# Patient Record
Sex: Female | Born: 1960 | Race: White | Hispanic: No | Marital: Married | Smoking: Current every day smoker
Health system: Southern US, Community
[De-identification: ages and names within clinical notes are randomized; demographics above are authoritative.]

## PROBLEM LIST (undated history)

## (undated) DIAGNOSIS — I10 Essential (primary) hypertension: Secondary | ICD-10-CM

## (undated) DIAGNOSIS — L309 Dermatitis, unspecified: Secondary | ICD-10-CM

## (undated) DIAGNOSIS — G43909 Migraine, unspecified, not intractable, without status migrainosus: Secondary | ICD-10-CM

## (undated) DIAGNOSIS — E7800 Pure hypercholesterolemia, unspecified: Secondary | ICD-10-CM

## (undated) HISTORY — DX: Dermatitis, unspecified: L30.9

## (undated) HISTORY — PX: FACIAL FRACTURE SURGERY: SHX1570

## (undated) HISTORY — PX: ANKLE FUSION: SHX881

---

## 2001-08-17 ENCOUNTER — Other Ambulatory Visit: Admission: RE | Admit: 2001-08-17 | Discharge: 2001-08-17 | Payer: Self-pay | Admitting: Family Medicine

## 2002-05-27 ENCOUNTER — Encounter: Payer: Self-pay | Admitting: Family Medicine

## 2002-05-27 ENCOUNTER — Encounter: Admission: RE | Admit: 2002-05-27 | Discharge: 2002-05-27 | Payer: Self-pay | Admitting: Family Medicine

## 2002-11-29 ENCOUNTER — Ambulatory Visit (HOSPITAL_BASED_OUTPATIENT_CLINIC_OR_DEPARTMENT_OTHER): Admission: RE | Admit: 2002-11-29 | Discharge: 2002-11-29 | Payer: Self-pay | Admitting: Orthopedic Surgery

## 2003-08-08 ENCOUNTER — Encounter: Admission: RE | Admit: 2003-08-08 | Discharge: 2003-08-08 | Payer: Self-pay | Admitting: Orthopedic Surgery

## 2006-07-19 LAB — HM PAP SMEAR

## 2006-07-22 LAB — HM DEXA SCAN

## 2006-08-11 ENCOUNTER — Ambulatory Visit: Payer: Self-pay

## 2006-08-30 ENCOUNTER — Ambulatory Visit: Payer: Self-pay

## 2007-03-14 ENCOUNTER — Emergency Department: Payer: Self-pay | Admitting: Emergency Medicine

## 2007-03-28 ENCOUNTER — Ambulatory Visit: Payer: Self-pay

## 2009-10-23 ENCOUNTER — Emergency Department: Payer: Self-pay | Admitting: Emergency Medicine

## 2010-05-07 ENCOUNTER — Emergency Department: Payer: Self-pay | Admitting: Emergency Medicine

## 2010-06-30 ENCOUNTER — Ambulatory Visit: Payer: Self-pay

## 2011-10-15 ENCOUNTER — Ambulatory Visit: Payer: Self-pay | Admitting: Family Medicine

## 2011-11-07 ENCOUNTER — Emergency Department: Payer: Self-pay | Admitting: Emergency Medicine

## 2011-11-18 LAB — HM COLONOSCOPY

## 2013-05-30 ENCOUNTER — Ambulatory Visit: Payer: Self-pay | Admitting: Family Medicine

## 2013-10-23 ENCOUNTER — Ambulatory Visit: Payer: Self-pay | Admitting: Family Medicine

## 2014-01-17 LAB — BASIC METABOLIC PANEL
BUN: 9 mg/dL (ref 4–21)
Creatinine: 1 mg/dL (ref 0.5–1.1)
Glucose: 109 mg/dL
Potassium: 5 mmol/L (ref 3.4–5.3)
Sodium: 143 mmol/L (ref 137–147)

## 2014-01-17 LAB — CBC AND DIFFERENTIAL
HCT: 42 % (ref 36–46)
HEMOGLOBIN: 14.4 g/dL (ref 12.0–16.0)
NEUTROS ABS: 45 /uL
PLATELETS: 256 10*3/uL (ref 150–399)
WBC: 6.3 10*3/mL

## 2014-01-17 LAB — HEPATIC FUNCTION PANEL
ALT: 19 U/L (ref 7–35)
AST: 15 U/L (ref 13–35)
Alkaline Phosphatase: 80 U/L (ref 25–125)
Bilirubin, Total: 0.6 mg/dL

## 2014-01-17 LAB — LIPID PANEL
Cholesterol: 162 mg/dL (ref 0–200)
HDL: 38 mg/dL (ref 35–70)
LDL CALC: 98 mg/dL
LDl/HDL Ratio: 2.6
Triglycerides: 131 mg/dL (ref 40–160)

## 2014-01-22 ENCOUNTER — Ambulatory Visit: Payer: Self-pay | Admitting: Family Medicine

## 2014-01-22 LAB — HM MAMMOGRAPHY: HM Mammogram: NEGATIVE

## 2014-09-12 ENCOUNTER — Emergency Department (HOSPITAL_COMMUNITY): Payer: Worker's Compensation

## 2014-09-12 ENCOUNTER — Encounter (HOSPITAL_COMMUNITY): Payer: Self-pay | Admitting: Nurse Practitioner

## 2014-09-12 ENCOUNTER — Emergency Department (HOSPITAL_COMMUNITY)
Admission: EM | Admit: 2014-09-12 | Discharge: 2014-09-12 | Disposition: A | Discharge status: Home / Self Care | Payer: Worker's Compensation | Attending: Emergency Medicine | Admitting: Emergency Medicine

## 2014-09-12 DIAGNOSIS — S0181XA Laceration without foreign body of other part of head, initial encounter: Secondary | ICD-10-CM

## 2014-09-12 DIAGNOSIS — S060X1A Concussion with loss of consciousness of 30 minutes or less, initial encounter: Secondary | ICD-10-CM | POA: Insufficient documentation

## 2014-09-12 DIAGNOSIS — G43909 Migraine, unspecified, not intractable, without status migrainosus: Secondary | ICD-10-CM | POA: Diagnosis not present

## 2014-09-12 DIAGNOSIS — I10 Essential (primary) hypertension: Secondary | ICD-10-CM | POA: Diagnosis not present

## 2014-09-12 DIAGNOSIS — W19XXXA Unspecified fall, initial encounter: Secondary | ICD-10-CM

## 2014-09-12 DIAGNOSIS — W01198A Fall on same level from slipping, tripping and stumbling with subsequent striking against other object, initial encounter: Secondary | ICD-10-CM | POA: Diagnosis not present

## 2014-09-12 DIAGNOSIS — S79921A Unspecified injury of right thigh, initial encounter: Secondary | ICD-10-CM | POA: Insufficient documentation

## 2014-09-12 DIAGNOSIS — Z791 Long term (current) use of non-steroidal anti-inflammatories (NSAID): Secondary | ICD-10-CM | POA: Diagnosis not present

## 2014-09-12 DIAGNOSIS — Y998 Other external cause status: Secondary | ICD-10-CM | POA: Insufficient documentation

## 2014-09-12 DIAGNOSIS — E780 Pure hypercholesterolemia: Secondary | ICD-10-CM | POA: Diagnosis not present

## 2014-09-12 DIAGNOSIS — Y9301 Activity, walking, marching and hiking: Secondary | ICD-10-CM | POA: Diagnosis not present

## 2014-09-12 DIAGNOSIS — S0990XA Unspecified injury of head, initial encounter: Secondary | ICD-10-CM | POA: Diagnosis present

## 2014-09-12 DIAGNOSIS — Z79899 Other long term (current) drug therapy: Secondary | ICD-10-CM | POA: Diagnosis not present

## 2014-09-12 DIAGNOSIS — Y92480 Sidewalk as the place of occurrence of the external cause: Secondary | ICD-10-CM | POA: Diagnosis not present

## 2014-09-12 DIAGNOSIS — S8011XA Contusion of right lower leg, initial encounter: Secondary | ICD-10-CM | POA: Insufficient documentation

## 2014-09-12 HISTORY — DX: Essential (primary) hypertension: I10

## 2014-09-12 HISTORY — DX: Migraine, unspecified, not intractable, without status migrainosus: G43.909

## 2014-09-12 HISTORY — DX: Pure hypercholesterolemia, unspecified: E78.00

## 2014-09-12 MED ORDER — IBUPROFEN 800 MG PO TABS: 800.0000 mg | ORAL_TABLET | Freq: Three times a day (TID) | ORAL | Status: DC

## 2014-09-12 MED ORDER — HYDROCODONE-ACETAMINOPHEN 5-325 MG PO TABS: 1.0000 | ORAL_TABLET | Freq: Four times a day (QID) | ORAL | Status: DC | PRN

## 2014-09-12 MED ORDER — LIDOCAINE-EPINEPHRINE 2 %-1:100000 IJ SOLN
20.0000 mL | Freq: Once | INTRAMUSCULAR | Status: AC
  Administered 2014-09-12: 20 mL
  Filled 2014-09-12: qty 1

## 2014-09-12 NOTE — ED Notes (Signed)
Pt presents with an obvious head injury, 1-2 inch head lac secondary to a fall, unsure if there was LOC, denies being on anticoagulants, AOx4 at this time, controled scant bleeding. NAD

## 2014-09-12 NOTE — ED Notes (Signed)
Bed: WA07 Expected date:  Expected time:  Means of arrival:  Comments: EMS 

## 2014-09-12 NOTE — Discharge Instructions (Signed)
Concussion A concussion is a brain injury. It is caused by:  A hit to the head.  A quick and sudden movement (jolt) of the head or neck. A concussion is usually not life threatening. Even so, it can cause serious problems. If you had a concussion before, you may have concussion-like problems after a hit to your head. HOME CARE General Instructions  Follow your doctor's directions carefully.  Take medicines only as told by your doctor.  Only take medicines your doctor says are safe.  Do not drink alcohol until your doctor says it is okay. Alcohol and some drugs can slow down healing. They can also put you at risk for further injury.  If you are having trouble remembering things, write them down.  Try to do one thing at a time if you get distracted easily. For example, do not watch TV while making dinner.  Talk to your family members or close friends when making important decisions.  Follow up with your doctor as told.  Watch your symptoms. Tell others to do the same. Serious problems can sometimes happen after a concussion. Older adults are more likely to have these problems.  Tell your teachers, school nurse, school counselor, coach, Event organiserathletic trainer, or work Production designer, theatre/television/filmmanager about your concussion. Tell them about what you can or cannot do. They should watch to see if:  It gets even harder for you to pay attention or concentrate.  It gets even harder for you to remember things or learn new things.  You need more time than normal to finish things.  You become annoyed (irritable) more than before.  You are not able to deal with stress as well.  You have more problems than before.  Rest. Make sure you:  Get plenty of sleep at night.  Go to sleep early.  Go to bed at the same time every day. Try to wake up at the same time.  Rest during the day.  Take naps when you feel tired.  Limit activities where you have to think a lot or concentrate. These include:  Doing  homework.  Doing work related to a job.  Watching TV.  Using the computer. Returning To Your Regular Activities Return to your normal activities slowly, not all at once. You must give your body and brain enough time to heal.   Do not play sports or do other athletic activities until your doctor says it is okay.  Ask your doctor when you can drive, ride a bicycle, or work other vehicles or machines. Never do these things if you feel dizzy.  Ask your doctor about when you can return to work or school. Preventing Another Concussion It is very important to avoid another brain injury, especially before you have healed. In rare cases, another injury can lead to permanent brain damage, brain swelling, or death. The risk of this is greatest during the first 7-10 days after your injury. Avoid injuries by:   Wearing a seat belt when riding in a car.  Not drinking too much alcohol.  Avoiding activities that could lead to a second concussion (such as contact sports).  Wearing a helmet when doing activities like:  Biking.  Skiing.  Skateboarding.  Skating.  Making your home safer by:  Removing things from the floor or stairways that could make you trip.  Using grab bars in bathrooms and handrails by stairs.  Placing non-slip mats on floors and in bathtubs.  Improve lighting in dark areas. GET HELP IF:  It  gets even harder for you to pay attention or concentrate.  It gets even harder for you to remember things or learn new things.  You need more time than normal to finish things.  You become annoyed (irritable) more than before.  You are not able to deal with stress as well.  You have more problems than before.  You have problems keeping your balance.  You are not able to react quickly when you should. Get help if you have any of these problems for more than 2 weeks:   Lasting (chronic) headaches.  Dizziness or trouble balancing.  Feeling sick to your stomach  (nausea).  Seeing (vision) problems.  Being affected by noises or light more than normal.  Feeling sad, low, down in the dumps, blue, gloomy, or empty (depressed).  Mood changes (mood swings).  Feeling of fear or nervousness about what may happen (anxiety).  Feeling annoyed.  Memory problems.  Problems concentrating or paying attention.  Sleep problems.  Feeling tired all the time. GET HELP RIGHT AWAY IF:   You have bad headaches or your headaches get worse.  You have weakness (even if it is in one hand, leg, or part of the face).  You have loss of feeling (numbness).  You feel off balance.  You keep throwing up (vomiting).  You feel tired.  One black center of your eye (pupil) is larger than the other.  You twitch or shake violently (convulse).  Your speech is not clear (slurred).  You are more confused, easily angered (agitated), or annoyed than before.  You have more trouble resting than before.  You are unable to recognize people or places.  You have neck pain.  It is difficult to wake you up.  You have unusual behavior changes.  You pass out (lose consciousness). MAKE SURE YOU:   Understand these instructions.  Will watch your condition.  Will get help right away if you are not doing well or get worse. Document Released: 07/21/2009 Document Revised: 12/17/2013 Document Reviewed: 02/22/2013 [facility] Patient Information 2015 [place], [place]. This information is not intended to replace advice given to you by your health care provider. Make sure you discuss any questions you have with your health care provider.  Laceration Care, Adult A laceration is a cut or lesion that goes through all layers of the skin and into the tissue just beneath the skin. TREATMENT  Some lacerations may not require closure. Some lacerations may not be able to be closed due to an increased risk of infection. It is important to see your caregiver as soon as possible after an  injury to minimize the risk of infection and maximize the opportunity for successful closure. If closure is appropriate, pain medicines may be given, if needed. The wound will be cleaned to help prevent infection. Your caregiver will use stitches (sutures), staples, wound glue (adhesive), or skin adhesive strips to repair the laceration. These tools bring the skin edges together to allow for faster healing and a better cosmetic outcome. However, all wounds will heal with a scar. Once the wound has healed, scarring can be minimized by covering the wound with sunscreen during the day for 1 full year. HOME CARE INSTRUCTIONS  For sutures or staples:  Keep the wound clean and dry.  If you were given a bandage (dressing), you should change it at least once a day. Also, change the dressing if it becomes wet or dirty, or as directed by your caregiver.  Wash the wound with soap  and water 2 times a day. Rinse the wound off with water to remove all soap. Pat the wound dry with a clean towel.  After cleaning, apply a thin layer of the antibiotic ointment as recommended by your caregiver. This will help prevent infection and keep the dressing from sticking.  You may shower as usual after the first 24 hours. Do not soak the wound in water until the sutures are removed.  Only take over-the-counter or prescription medicines for pain, discomfort, or fever as directed by your caregiver.  Get your sutures or staples removed as directed by your caregiver. For skin adhesive strips:  Keep the wound clean and dry.  Do not get the skin adhesive strips wet. You may bathe carefully, using caution to keep the wound dry.  If the wound gets wet, pat it dry with a clean towel.  Skin adhesive strips will fall off on their own. You may trim the strips as the wound heals. Do not remove skin adhesive strips that are still stuck to the wound. They will fall off in time. For wound adhesive:  You may briefly wet your wound  in the shower or bath. Do not soak or scrub the wound. Do not swim. Avoid periods of heavy perspiration until the skin adhesive has fallen off on its own. After showering or bathing, gently pat the wound dry with a clean towel.  Do not apply liquid medicine, cream medicine, or ointment medicine to your wound while the skin adhesive is in place. This may loosen the film before your wound is healed.  If a dressing is placed over the wound, be careful not to apply tape directly over the skin adhesive. This may cause the adhesive to be pulled off before the wound is healed.  Avoid prolonged exposure to sunlight or tanning lamps while the skin adhesive is in place. Exposure to ultraviolet light in the first year will darken the scar.  The skin adhesive will usually remain in place for 5 to 10 days, then naturally fall off the skin. Do not pick at the adhesive film. You may need a tetanus shot if:  You cannot remember when you had your last tetanus shot.  You have never had a tetanus shot. If you get a tetanus shot, your arm may swell, get red, and feel warm to the touch. This is common and not a problem. If you need a tetanus shot and you choose not to have one, there is a rare chance of getting tetanus. Sickness from tetanus can be serious. SEEK MEDICAL CARE IF:   You have redness, swelling, or increasing pain in the wound.  You see a red line that goes away from the wound.  You have yellowish-white fluid (pus) coming from the wound.  You have a fever.  You notice a bad smell coming from the wound or dressing.  Your wound breaks open before or after sutures have been removed.  You notice something coming out of the wound such as wood or glass.  Your wound is on your hand or foot and you cannot move a finger or toe. SEEK IMMEDIATE MEDICAL CARE IF:   Your pain is not controlled with prescribed medicine.  You have severe swelling around the wound causing pain and numbness or a change in  color in your arm, hand, leg, or foot.  Your wound splits open and starts bleeding.  You have worsening numbness, weakness, or loss of function of any joint around or beyond the  wound.  You develop painful lumps near the wound or on the skin anywhere on your body. MAKE SURE YOU:   Understand these instructions.  Will watch your condition.  Will get help right away if you are not doing well or get worse. Document Released: 08/02/2005 Document Revised: 10/25/2011 Document Reviewed: 01/26/2011 [facility] Patient Information 2015 [place]. This information is not intended to replace advice given to you by your health care provider. Make sure you discuss any questions you have with your health care provider.  Hematoma A hematoma is a collection of blood under the skin, in an organ, in a body space, in a joint space, or in other tissue. The blood can clot to form a lump that you can see and feel. The lump is often firm and may sometimes become sore and tender. Most hematomas get better in a few days to weeks. However, some hematomas may be serious and require medical care. Hematomas can range in size from very small to very large. CAUSES  A hematoma can be caused by a blunt or penetrating injury. It can also be caused by spontaneous leakage from a blood vessel under the skin. Spontaneous leakage from a blood vessel is more likely to occur in older people, especially those taking blood thinners. Sometimes, a hematoma can develop after certain medical procedures. SIGNS AND SYMPTOMS   A firm lump on the body.  Possible pain and tenderness in the area.  Bruising.Blue, dark blue, purple-red, or yellowish skin may appear at the site of the hematoma if the hematoma is close to the surface of the skin. For hematomas in deeper tissues or body spaces, the signs and symptoms may be subtle. For example, an intra-abdominal hematoma may cause abdominal pain, weakness, fainting, and shortness of breath.  An intracranial hematoma may cause a headache or symptoms such as weakness, trouble speaking, or a change in consciousness. DIAGNOSIS  A hematoma can usually be diagnosed based on your medical history and a physical exam. Imaging tests may be needed if your health care provider suspects a hematoma in deeper tissues or body spaces, such as the abdomen, head, or chest. These tests may include ultrasonography or a CT scan.  TREATMENT  Hematomas usually go away on their own over time. Rarely does the blood need to be drained out of the body. Large hematomas or those that may affect vital organs will sometimes need surgical drainage or monitoring. HOME CARE INSTRUCTIONS   Apply ice to the injured area:   Put ice in a plastic bag.   Place a towel between your skin and the bag.   Leave the ice on for 20 minutes, 2-3 times a day for the first 1 to 2 days.   After the first 2 days, switch to using warm compresses on the hematoma.   Elevate the injured area to help decrease pain and swelling. Wrapping the area with an elastic bandage may also be helpful. Compression helps to reduce swelling and promotes shrinking of the hematoma. Make sure the bandage is not wrapped too tight.   If your hematoma is on a lower extremity and is painful, crutches may be helpful for a couple days.   Only take over-the-counter or prescription medicines as directed by your health care provider. SEEK IMMEDIATE MEDICAL CARE IF:   You have increasing pain, or your pain is not controlled with medicine.   You have a fever.   You have worsening swelling or discoloration.   Your skin over the  hematoma breaks or starts bleeding.   Your hematoma is in your chest or abdomen and you have weakness, shortness of breath, or a change in consciousness.  Your hematoma is on your scalp (caused by a fall or injury) and you have a worsening headache or a change in alertness or consciousness. MAKE SURE YOU:   Understand  these instructions.  Will watch your condition.  Will get help right away if you are not doing well or get worse. Document Released: 03/16/2004 Document Revised: 04/04/2013 Document Reviewed: 01/10/2013 [facility] Patient Information 2015 [place], [place]. This information is not intended to replace advice given to you by your health care provider. Make sure you discuss any questions you have with your health care provider.

## 2014-09-12 NOTE — ED Provider Notes (Signed)
CSN: [id number]     Arrival date & time 09/12/14  2012 History   First MD Initiated Contact with Patient 09/12/14 2016     Chief Complaint  Patient presents with  . Head Injury  . Fall   HPI  Patient is a 54 year old female with past medical history of hypertension, hypercholesterolemia, and migraines who presents emergency room for evaluation after a fall. Patient states that she was walking down the sidewalk and tripped over an object that she kicked with her foot and the next thing she knew she was on the ground looking up. She fell and struck her head. She is unsure whether she lost consciousness. She did "cut over her right eye that has been bothering her. She is not on blood thinners. Patient denies any changes in her vision, confusion, nausea, vomiting, neck pain, back pain, shortness breath, dizziness.  Patient does note that she is having some pain in her right hip. She thinks that she may have fallen on the hip. Patient also notes that she has pain in her right ear. She states that she recently had an ear infection and vertigo in that ear.  Past Medical History  Diagnosis Date  . Hypertension   . Hypercholesteremia   . Migraine    Past Surgical History  Procedure Laterality Date  . Facial fracture surgery     History reviewed. No pertinent family history. History  Substance Use Topics  . Smoking status: Never Smoker   . Smokeless tobacco: Not on file  . Alcohol Use: No   OB History    No data available     Review of Systems  Constitutional: Negative for fever, chills and fatigue.  Eyes: Negative for visual disturbance.  Respiratory: Negative for chest tightness and shortness of breath.   Cardiovascular: Negative for chest pain and palpitations.  Gastrointestinal: Negative for nausea, vomiting, abdominal pain, diarrhea and constipation.  Musculoskeletal: Negative for back pain and neck pain.  Neurological: Positive for headaches. Negative for dizziness, syncope,  weakness and light-headedness.  Psychiatric/Behavioral: Negative for confusion.  All other systems reviewed and are negative.     Allergies  Review of patient's allergies indicates no known allergies.  Home Medications   Prior to Admission medications   Medication Sig Start Date End Date Taking? Authorizing Provider  ALPRAZolam Prudy Feeler) 0.5 MG tablet Take 0.5 mg by mouth daily.   Yes Historical Provider, MD  esomeprazole (NEXIUM) 40 MG capsule Take 40 mg by mouth daily at 12 noon.   Yes Historical Provider, MD  gabapentin (NEURONTIN) 300 MG capsule Take 300 mg by mouth at bedtime.   Yes Historical Provider, MD  HYDROcodone-acetaminophen (NORCO/VICODIN) 5-325 MG per tablet Take 1 tablet by mouth every 6 (six) hours as needed for moderate pain or severe pain. 09/12/14   Mahiya Kercheval A Forcucci, PA-C  ibuprofen (ADVIL,MOTRIN) 800 MG tablet Take 1 tablet (800 mg total) by mouth 3 (three) times daily. 09/12/14   Clairissa Valvano A Forcucci, PA-C  meloxicam (MOBIC) 7.5 MG tablet Take 7.5 mg by mouth 2 (two) times daily.   Yes Historical Provider, MD  predniSONE (DELTASONE) 10 MG tablet Take 20 mg by mouth 2 (two) times daily. 2 tablets in the morning and 2 tablets in the evening   Yes Historical Provider, MD  propranolol (INNOPRAN XL) 120 MG 24 hr capsule Take 120 mg by mouth at bedtime.   Yes Historical Provider, MD  sertraline (ZOLOFT) 100 MG tablet Take 100 mg by mouth at bedtime.   Yes  Historical Provider, MD  simvastatin (ZOCOR) 20 MG tablet Take 20 mg by mouth daily.   Yes Historical Provider, MD  tiZANidine (ZANAFLEX) 4 MG tablet Take 4 mg by mouth 2 (two) times daily as needed for muscle spasms.   Yes Historical Provider, MD  Vitamin D, Ergocalciferol, (DRISDOL) 50000 UNITS CAPS capsule Take 50,000 Units by mouth every 30 (thirty) days.   Yes Historical Provider, MD   BP 157/92 mmHg  Pulse 59  Temp(Src) 98.9 F (37.2 C) (Oral)  Resp 16  SpO2 97%  LMP  (LMP Unknown) Physical Exam   Constitutional: She is oriented to person, place, and time. She appears well-developed and well-nourished. No distress.  HENT:  Head: Normocephalic.  Mouth/Throat: Oropharynx is clear and moist. No oropharyngeal exudate.  2 cm laceration surrounding hematoma. Cut has no bleeding actively. Wound appears to be clean and without visible foreign bodies. It is tender to palpation.  Eyes: Conjunctivae and EOM are normal. Pupils are equal, round, and reactive to light. No scleral icterus.  Neck: Normal range of motion and full passive range of motion without pain. Neck supple. No JVD present. No spinous process tenderness and no muscular tenderness present. No thyromegaly present.  Cardiovascular: Normal rate, regular rhythm, normal heart sounds and intact distal pulses.  Exam reveals no gallop and no friction rub.   No murmur heard. Pulmonary/Chest: Effort normal and breath sounds normal. No respiratory distress. She has no wheezes. She has no rales. She exhibits no tenderness.  Abdominal: Soft. Bowel sounds are normal. She exhibits no distension and no mass. There is no tenderness. There is no rebound and no guarding.  Musculoskeletal:  Mild tenderness palpation of the right thigh. Full passive range of motion with pain. There is irritability with internal and sternal rotation of the right hip.  Lymphadenopathy:    She has no cervical adenopathy.  Neurological: She is alert and oriented to person, place, and time. She has normal strength. No cranial nerve deficit or sensory deficit. Coordination normal.  Skin: Skin is warm and dry. She is not diaphoretic.  Psychiatric: She has a normal mood and affect. Her behavior is normal. Judgment and thought content normal.  Nursing note and vitals reviewed.   ED Course  Procedures (including critical care time) LACERATION REPAIR Performed by: Eben Burow Authorized by: Terri Piedra A Consent: Verbal consent obtained. Risks and benefits:  risks, benefits and alternatives were discussed Consent given by: patient Patient identity confirmed: provided demographic data Prepped and Draped in normal sterile fashion Wound explored  Laceration Location: right eyebrow  Laceration Length: 1 cm  No Foreign Bodies seen or palpated  Anesthesia: local infiltration  Local anesthetic: lidocaine 1% w epinephrine  Anesthetic total: 2 ml  Irrigation method: syringe Amount of cleaning: standard  Skin closure: close, ethilon 5-0  Number of sutures: 2  Technique: simple interupted  Patient tolerance: Patient tolerated the procedure well with no immediate complications.   Labs Reviewed - No data to display  Imaging Review Ct Head Wo Contrast  09/12/2014   CLINICAL DATA:  Larey Seat today, striking right superior orbit on sidewalk.  EXAM: CT HEAD WITHOUT CONTRAST  TECHNIQUE: Contiguous axial images were obtained from the base of the skull through the vertex without intravenous contrast.  COMPARISON:  None.  FINDINGS: There is no intracranial hemorrhage or extra-axial fluid collection.  The brain and CSF spaces appear unremarkable.  The calvarium and skullbase are intact. There is a right frontal scalp hematoma  IMPRESSION: Negative for  acute intracranial traumatic injury   Electronically Signed   By: Ellery Plunkaniel R Mitchell M.D.   On: 09/12/2014 21:20   Dg Hip Unilat With Pelvis 2-3 Views Right  09/12/2014   CLINICAL DATA:  The patient tripped while walking tonight. Right hip and mid upper leg pain. Initial encounter.  EXAM: DG HIP W/ PELVIS 2-3V*R*  COMPARISON:  None.  FINDINGS: Both hips are located. No fracture is identified. No degenerative change is seen. IUD is noted.  IMPRESSION: Negative exam.   Electronically Signed   By: Drusilla Kannerhomas  Dalessio M.D.   On: 09/12/2014 21:11     EKG Interpretation None      MDM   Final diagnoses:  Fall  Mild concussion, with loss of consciousness of 30 minutes or less, initial encounter  Facial  laceration, initial encounter  Leg hematoma, right, initial encounter   Patient is a 54 year old female who presents emergency room for evaluation after a fall. Patient has no neurological deficits on examination. Patient is unsure of loss of consciousness and had some forgetfulness. Head CT performed. Head CT negative for acute brain bleeding. Patient likely has mild concussion. I have discussed resting her brain, and refraining from activities because her headaches. I have encouraged her to gradually return to exercise as long as she is headache free. Patient did have a laceration which was repaired as seen above. T Given here. Patient to have stitches removed in 3-5 days. Right hip also x-rayed. Patient does have a hematoma with no acute fractures on x-ray. We'll discharge home with short course of hydrocodone. Patient to follow-up with PCP as needed.  Patient discussed with Dr. Jeraldine LootsLockwood who agrees with the above workup and plan. Patient stable for discharge.    Eben Burowourtney A Forcucci, PA-C 09/12/14 [id number]  Gerhard Munchobert Lockwood, MD 09/12/14 [phone]

## 2014-09-26 LAB — HEMOGLOBIN A1C: Hgb A1c MFr Bld: 6.1 % — AB (ref 4.0–6.0)

## 2014-09-26 LAB — TSH: TSH: 1.25 u[IU]/mL (ref 0.41–5.90)

## 2014-11-01 ENCOUNTER — Ambulatory Visit: Payer: Self-pay | Admitting: Neurology

## 2014-12-19 ENCOUNTER — Encounter: Payer: Self-pay | Admitting: Emergency Medicine

## 2014-12-19 DIAGNOSIS — R319 Hematuria, unspecified: Secondary | ICD-10-CM | POA: Insufficient documentation

## 2014-12-19 DIAGNOSIS — E1169 Type 2 diabetes mellitus with other specified complication: Secondary | ICD-10-CM | POA: Insufficient documentation

## 2014-12-19 DIAGNOSIS — R5383 Other fatigue: Secondary | ICD-10-CM | POA: Insufficient documentation

## 2014-12-19 DIAGNOSIS — G43909 Migraine, unspecified, not intractable, without status migrainosus: Secondary | ICD-10-CM | POA: Insufficient documentation

## 2014-12-19 DIAGNOSIS — F411 Generalized anxiety disorder: Secondary | ICD-10-CM | POA: Insufficient documentation

## 2014-12-19 DIAGNOSIS — G4730 Sleep apnea, unspecified: Secondary | ICD-10-CM | POA: Insufficient documentation

## 2014-12-19 DIAGNOSIS — K219 Gastro-esophageal reflux disease without esophagitis: Secondary | ICD-10-CM | POA: Insufficient documentation

## 2014-12-19 DIAGNOSIS — F17200 Nicotine dependence, unspecified, uncomplicated: Secondary | ICD-10-CM | POA: Insufficient documentation

## 2014-12-19 DIAGNOSIS — G4700 Insomnia, unspecified: Secondary | ICD-10-CM | POA: Insufficient documentation

## 2014-12-19 DIAGNOSIS — J42 Unspecified chronic bronchitis: Secondary | ICD-10-CM | POA: Insufficient documentation

## 2014-12-19 DIAGNOSIS — R2681 Unsteadiness on feet: Secondary | ICD-10-CM | POA: Insufficient documentation

## 2014-12-19 DIAGNOSIS — F0781 Postconcussional syndrome: Secondary | ICD-10-CM | POA: Insufficient documentation

## 2014-12-19 DIAGNOSIS — S060X9A Concussion with loss of consciousness of unspecified duration, initial encounter: Secondary | ICD-10-CM | POA: Insufficient documentation

## 2014-12-19 DIAGNOSIS — I10 Essential (primary) hypertension: Secondary | ICD-10-CM | POA: Insufficient documentation

## 2014-12-19 DIAGNOSIS — R7303 Prediabetes: Secondary | ICD-10-CM | POA: Insufficient documentation

## 2014-12-19 DIAGNOSIS — O139 Gestational [pregnancy-induced] hypertension without significant proteinuria, unspecified trimester: Secondary | ICD-10-CM | POA: Insufficient documentation

## 2014-12-19 DIAGNOSIS — E7800 Pure hypercholesterolemia, unspecified: Secondary | ICD-10-CM | POA: Insufficient documentation

## 2014-12-19 DIAGNOSIS — F339 Major depressive disorder, recurrent, unspecified: Secondary | ICD-10-CM | POA: Insufficient documentation

## 2014-12-19 DIAGNOSIS — F329 Major depressive disorder, single episode, unspecified: Secondary | ICD-10-CM | POA: Insufficient documentation

## 2014-12-19 DIAGNOSIS — S060XAA Concussion with loss of consciousness status unknown, initial encounter: Secondary | ICD-10-CM | POA: Insufficient documentation

## 2014-12-19 DIAGNOSIS — E538 Deficiency of other specified B group vitamins: Secondary | ICD-10-CM | POA: Insufficient documentation

## 2014-12-19 DIAGNOSIS — E785 Hyperlipidemia, unspecified: Secondary | ICD-10-CM | POA: Insufficient documentation

## 2014-12-19 DIAGNOSIS — F430 Acute stress reaction: Secondary | ICD-10-CM | POA: Insufficient documentation

## 2014-12-19 DIAGNOSIS — N951 Menopausal and female climacteric states: Secondary | ICD-10-CM | POA: Insufficient documentation

## 2014-12-19 DIAGNOSIS — E1159 Type 2 diabetes mellitus with other circulatory complications: Secondary | ICD-10-CM | POA: Insufficient documentation

## 2014-12-19 DIAGNOSIS — J309 Allergic rhinitis, unspecified: Secondary | ICD-10-CM | POA: Insufficient documentation

## 2014-12-19 DIAGNOSIS — I152 Hypertension secondary to endocrine disorders: Secondary | ICD-10-CM | POA: Insufficient documentation

## 2014-12-19 DIAGNOSIS — E559 Vitamin D deficiency, unspecified: Secondary | ICD-10-CM | POA: Insufficient documentation

## 2015-01-31 ENCOUNTER — Ambulatory Visit: Payer: Self-pay

## 2015-01-31 ENCOUNTER — Ambulatory Visit (INDEPENDENT_AMBULATORY_CARE_PROVIDER_SITE_OTHER): Payer: 59

## 2015-01-31 ENCOUNTER — Encounter: Payer: Self-pay | Admitting: Physician Assistant

## 2015-01-31 ENCOUNTER — Ambulatory Visit (INDEPENDENT_AMBULATORY_CARE_PROVIDER_SITE_OTHER): Payer: 59 | Admitting: Physician Assistant

## 2015-01-31 ENCOUNTER — Other Ambulatory Visit: Payer: Self-pay

## 2015-01-31 VITALS — BP 110/60 | HR 66 | Temp 97.7°F | Resp 16 | Ht 66.0 in | Wt 185.6 lb

## 2015-01-31 DIAGNOSIS — M26629 Arthralgia of temporomandibular joint, unspecified side: Secondary | ICD-10-CM | POA: Insufficient documentation

## 2015-01-31 DIAGNOSIS — E538 Deficiency of other specified B group vitamins: Secondary | ICD-10-CM | POA: Diagnosis not present

## 2015-01-31 DIAGNOSIS — J040 Acute laryngitis: Secondary | ICD-10-CM

## 2015-01-31 MED ORDER — CYANOCOBALAMIN 1000 MCG/ML IJ SOLN
1000.0000 ug | Freq: Once | INTRAMUSCULAR | Status: AC
  Administered 2015-01-31: 1000 ug via INTRAMUSCULAR

## 2015-01-31 NOTE — Progress Notes (Signed)
   Subjective:    Patient ID: Lisa Alvarado, female    DOB: 08/16/1961, 54 y.o.   MRN: [id number]  Cough This is a new problem. The current episode started yesterday. The problem has been unchanged. The problem occurs every few minutes. The cough is non-productive. Associated symptoms include nasal congestion and a sore throat. Pertinent negatives include no chest pain, chills, ear congestion, ear pain, fever, headaches, heartburn, hemoptysis, myalgias, postnasal drip, rash, rhinorrhea, shortness of breath, sweats, weight loss or wheezing. Nothing aggravates the symptoms. Risk factors for lung disease include animal exposure. She has tried nothing for the symptoms. Her past medical history is significant for environmental allergies. There is no history of asthma, bronchiectasis, bronchitis, COPD, emphysema or pneumonia.  Sore Throat  This is a new problem. The current episode started yesterday. The problem has been unchanged. Neither side of throat is experiencing more pain than the other. There has been no fever. The pain is at a severity of 1/10. Associated symptoms include congestion, coughing and a hoarse voice. Pertinent negatives include no abdominal pain, diarrhea, drooling, ear discharge, ear pain, headaches, plugged ear sensation, neck pain, shortness of breath, stridor, swollen glands, trouble swallowing or vomiting. She has had no exposure to strep or mono. She has tried nothing for the symptoms.      Review of Systems  Constitutional: Negative for fever, chills and weight loss.  HENT: Positive for congestion, hoarse voice and sore throat. Negative for drooling, ear discharge, ear pain, postnasal drip, rhinorrhea and trouble swallowing.   Respiratory: Positive for cough. Negative for hemoptysis, shortness of breath, wheezing and stridor.   Cardiovascular: Negative for chest pain.  Gastrointestinal: Negative for heartburn, vomiting, abdominal pain and diarrhea.  Musculoskeletal:  Negative for myalgias and neck pain.  Skin: Negative for rash.  Allergic/Immunologic: Positive for environmental allergies.  Neurological: Negative for headaches.       Objective:   Physical Exam  Constitutional: She appears well-developed and well-nourished. No distress.  HENT:  Head: Normocephalic and atraumatic.  Right Ear: Hearing, tympanic membrane, external ear and ear canal normal.  Left Ear: Hearing, tympanic membrane, external ear and ear canal normal.  Nose: Nose normal.  Mouth/Throat: Uvula is midline, oropharynx is clear and moist and mucous membranes are normal. No oropharyngeal exudate.  Eyes: Conjunctivae are normal. Pupils are equal, round, and reactive to light. Right eye exhibits no discharge. Left eye exhibits no discharge. No scleral icterus.  Neck: Normal range of motion. Neck supple. No tracheal deviation present. No thyromegaly present.  Cardiovascular: Normal rate, regular rhythm and intact distal pulses.  Exam reveals no gallop and no friction rub.   Murmur (known systolic murmur) heard. Pulmonary/Chest: Effort normal and breath sounds normal. No stridor. No respiratory distress. She has no wheezes. She has no rales. She exhibits no tenderness.  Lymphadenopathy:    She has no cervical adenopathy.  Skin: Skin is warm and dry. She is not diaphoretic.  Vitals reviewed.         Assessment & Plan:  1. Laryngitis New onset.  Advised to treat conservatively with voice rest, keep throat moist with frequent sips of liquid and hard, sugar-free candies.  Can use cough drops and delsym cough syrup at night to help her sleep.

## 2015-01-31 NOTE — Patient Instructions (Signed)
Laryngitis °At the top of your windpipe is your voice box. It is the source of your voice. Inside your voice box are 2 bands of muscles called vocal cords. When you breathe, your vocal cords are relaxed and open so that air can get into the lungs. When you decide to say something, these cords come together and vibrate. The sound from these vibrations goes into your throat and comes out through your mouth as sound.  °Laryngitis is an inflammation of the vocal cords that causes hoarseness, cough, loss of voice, sore throat, and dry throat. Laryngitis can be temporary (acute) or long-term (chronic). Most cases of acute laryngitis improve with time.Chronic laryngitis lasts for more than 3 weeks. °CAUSES °Laryngitis can often be related to excessive smoking, talking, or yelling, as well as inhalation of toxic fumes and allergies. Acute laryngitis is usually caused by a viral infection, vocal strain, measles or mumps, or bacterial infections. Chronic laryngitis is usually caused by vocal cord strain, vocal cord injury, postnasal drip, growths on the vocal cords, or acid reflux. °SYMPTOMS  °· Cough. °· Sore throat. °· Dry throat. °RISK FACTORS °· Respiratory infections. °· Exposure to irritating substances, such as cigarette smoke, excessive amounts of alcohol, stomach acids, and workplace chemicals. °· Voice trauma, such as vocal cord injury from shouting or speaking too loud. °DIAGNOSIS  °Your cargiver will perform a physical exam. During the physical exam, your caregiver will examine your throat. The most common sign of laryngitis is hoarseness. Laryngoscopy may be necessary to confirm the diagnosis of this condition. This procedure allows your caregiver to look into the larynx. °HOME CARE INSTRUCTIONS °· Drink enough fluids to keep your urine clear or pale yellow. °· Rest until you no longer have symptoms or as directed by your caregiver. °· Breathe in moist air. °· Take all medicine as directed by your  caregiver. °· Do not smoke. °· Talk as little as possible (this includes whispering). °· Write on paper instead of talking until your voice is back to normal. °· Follow up with your caregiver if your condition has not improved after 10 days. °SEEK MEDICAL CARE IF:  °· You have trouble breathing. °· You cough up blood. °· You have persistent fever. °· You have increasing pain. °· You have difficulty swallowing. °MAKE SURE YOU: °· Understand these instructions. °· Will watch your condition. °· Will get help right away if you are not doing well or get worse. °Document Released: 08/02/2005 Document Revised: 10/25/2011 Document Reviewed: 10/08/2010 °[employer]® Patient Information ©2015 [employer]. This information is not intended to replace advice given to you by your health care provider. Make sure you discuss any questions you have with your health care provider. ° °

## 2015-01-31 NOTE — Progress Notes (Signed)
Vitamin B12 injection today in office. Tolerated well. Will come back in 4 weeks per MD's order.

## 2015-02-04 ENCOUNTER — Ambulatory Visit (INDEPENDENT_AMBULATORY_CARE_PROVIDER_SITE_OTHER): Payer: 59 | Admitting: Family Medicine

## 2015-02-04 ENCOUNTER — Encounter: Payer: Self-pay | Admitting: Family Medicine

## 2015-02-04 VITALS — BP 100/60 | HR 60 | Temp 98.4°F | Resp 16 | Wt 182.8 lb

## 2015-02-04 DIAGNOSIS — R42 Dizziness and giddiness: Secondary | ICD-10-CM

## 2015-02-04 DIAGNOSIS — H6692 Otitis media, unspecified, left ear: Secondary | ICD-10-CM

## 2015-02-04 MED ORDER — AMOXICILLIN 500 MG PO CAPS: 1000.0000 mg | ORAL_CAPSULE | Freq: Two times a day (BID) | ORAL | Status: AC

## 2015-02-04 NOTE — Progress Notes (Signed)
      Patient: Lisa Alvarado Female    DOB: August 04, 1961   54 y.o.   MRN: [id number] Visit Date: 02/04/2015  Today's Provider: Mila Merry, MD   No chief complaint on file.  Subjective:    Dizziness This is a new problem. The current episode started in the past 7 days. The problem occurs intermittently. The problem has been gradually worsening. Associated symptoms include coughing, fatigue and headaches. Pertinent negatives include no chills, diaphoresis, fever, nausea, numbness, visual change or vomiting. The symptoms are aggravated by bending and standing. She has tried rest for the symptoms. The treatment provided mild relief.  Otalgia  There is pain in both (Left ear hurt more) ears. This is a new problem. The current episode started in the past 7 days. The problem occurs constantly. The problem has been gradually worsening. There has been no fever. The pain is at a severity of 5/10. The pain is moderate. Associated symptoms include coughing, headaches and hearing loss. Pertinent negatives include no vomiting. She has tried ear drops for the symptoms. The treatment provided no relief.  Patient had Vertigo in January 2016. Patient has fallen several times since then and has had evaluation by ENT and neurology. Started having left ear pain last week, Seen PA on 16th with viral URi, but pain has been getting worse, and staring to have light headed spells with sudden movements and when standing quickly.       Review of Systems  Constitutional: Positive for fatigue. Negative for fever, chills and diaphoresis.  HENT: Positive for ear pain and hearing loss.   Respiratory: Positive for cough and chest tightness.   Cardiovascular: Negative.   Gastrointestinal: Negative for nausea and vomiting.  Endocrine: Negative for cold intolerance, heat intolerance and polydipsia.  Neurological: Positive for dizziness and headaches. Negative for speech difficulty, light-headedness and numbness.     History  Substance Use Topics  . Smoking status: Never Smoker   . Smokeless tobacco: Not on file  . Alcohol Use: No   Objective:   BP 100/60 mmHg  Pulse 60  Temp(Src) 98.4 F (36.9 C) (Oral)  Resp 16  Wt 182 lb 12.8 oz (82.918 kg)  SpO2 98%  Physical Exam General Appearance:    Alert, cooperative, no distress  HENT:   right TM normal without fluid or infection, left TM red, dull, bulging, neck without nodes, throat normal without erythema or exudate and pharynx erythematous without exudate  Eyes:    PERRL, conjunctiva/corneas clear, EOM's intact       Lungs:     Clear to auscultation bilaterally, respirations unlabored  Heart:    Regular rate and rhythm  Neurologic:   Awake, alert, oriented x 3. No apparent focal neurological           defect.         Assessment & Plan:     1. Dizziness Ongoing for several months, but has recently been more c/w vertigo, possibly related to acute URI. Will check labs and treat OM as below. Return for further evaluation by her PCP if labs are normal and not improving with treatment of OM  - Comprehensive metabolic panel - CBC - Magnesium  2. Otitis media follow-up, not resolved, left    - amoxicillin (AMOXIL) 500 MG capsule; Take 2 capsules (1,000 mg total) by mouth 2 (two) times daily.  Dispense: 40 capsule; Refill: 0  Mila Merry, MD 02/04/2015

## 2015-02-05 ENCOUNTER — Other Ambulatory Visit: Payer: Self-pay

## 2015-02-05 ENCOUNTER — Telehealth: Payer: Self-pay

## 2015-02-05 DIAGNOSIS — M26629 Arthralgia of temporomandibular joint, unspecified side: Secondary | ICD-10-CM

## 2015-02-05 LAB — CBC
HEMATOCRIT: 42.4 % (ref 34.0–46.6)
HEMOGLOBIN: 14.7 g/dL (ref 11.1–15.9)
MCH: 30.8 pg (ref 26.6–33.0)
MCHC: 34.7 g/dL (ref 31.5–35.7)
MCV: 89 fL (ref 79–97)
Platelets: 241 10*3/uL (ref 150–379)
RBC: 4.78 x10E6/uL (ref 3.77–5.28)
RDW: 13.6 % (ref 12.3–15.4)
WBC: 7.8 10*3/uL (ref 3.4–10.8)

## 2015-02-05 LAB — COMPREHENSIVE METABOLIC PANEL
ALK PHOS: 98 IU/L (ref 39–117)
ALT: 12 IU/L (ref 0–32)
AST: 16 IU/L (ref 0–40)
Albumin/Globulin Ratio: 1.5 (ref 1.1–2.5)
Albumin: 4.6 g/dL (ref 3.5–5.5)
BUN / CREAT RATIO: 20 (ref 9–23)
BUN: 19 mg/dL (ref 6–24)
Bilirubin Total: 0.3 mg/dL (ref 0.0–1.2)
CHLORIDE: 100 mmol/L (ref 97–108)
CO2: 21 mmol/L (ref 18–29)
Calcium: 9.2 mg/dL (ref 8.7–10.2)
Creatinine, Ser: 0.96 mg/dL (ref 0.57–1.00)
GFR calc non Af Amer: 68 mL/min/{1.73_m2} (ref >59)
GFR, EST AFRICAN AMERICAN: 78 mL/min/{1.73_m2} (ref >59)
GLUCOSE: 110 mg/dL — AB (ref 65–99)
Globulin, Total: 3.1 g/dL (ref 1.5–4.5)
Potassium: 4 mmol/L (ref 3.5–5.2)
SODIUM: 140 mmol/L (ref 134–144)
TOTAL PROTEIN: 7.7 g/dL (ref 6.0–8.5)

## 2015-02-05 LAB — MAGNESIUM: Magnesium: 2.3 mg/dL (ref 1.6–2.3)

## 2015-02-05 MED ORDER — MELOXICAM 7.5 MG PO TABS: 7.5000 mg | ORAL_TABLET | Freq: Two times a day (BID) | ORAL | Status: DC

## 2015-02-05 NOTE — Telephone Encounter (Signed)
Pt advised as directed below.   Thanks,   -Oryon Gary  

## 2015-02-05 NOTE — Telephone Encounter (Signed)
-----   Message from Malva Limes, MD sent at 02/05/2015  1:08 PM EDT ----- Labs are all normal. Dizziness may be a result of ear infection. Call or return for further evaluation of dizziness is not better when finished with antibiotic.

## 2015-03-05 ENCOUNTER — Ambulatory Visit (INDEPENDENT_AMBULATORY_CARE_PROVIDER_SITE_OTHER): Payer: 59

## 2015-03-05 ENCOUNTER — Ambulatory Visit: Payer: 59

## 2015-03-05 ENCOUNTER — Other Ambulatory Visit: Payer: Self-pay

## 2015-03-05 DIAGNOSIS — E538 Deficiency of other specified B group vitamins: Secondary | ICD-10-CM | POA: Diagnosis not present

## 2015-03-05 MED ORDER — CYANOCOBALAMIN 1000 MCG/ML IJ SOLN
1000.0000 ug | Freq: Once | INTRAMUSCULAR | Status: AC
  Administered 2015-03-05: 1000 ug via INTRAMUSCULAR

## 2015-03-05 NOTE — Telephone Encounter (Signed)
Pt requiring medication refill for simvastatin and esomeprazole, she stated that she has an appointment for a physical with Wyatt PortelaJenni Bernett, PA  on August 3th.

## 2015-03-05 NOTE — Progress Notes (Signed)
Received Vitamin B 12 injection today. Tolerated well. Will come back in 4 weeks per MD's order.

## 2015-03-06 MED ORDER — SIMVASTATIN 20 MG PO TABS: 20.0000 mg | ORAL_TABLET | Freq: Every day | ORAL | Status: DC

## 2015-03-06 MED ORDER — ESOMEPRAZOLE MAGNESIUM 40 MG PO CPDR: 40.0000 mg | DELAYED_RELEASE_CAPSULE | Freq: Two times a day (BID) | ORAL | Status: DC

## 2015-03-07 ENCOUNTER — Encounter: Payer: Self-pay | Admitting: Physician Assistant

## 2015-03-19 ENCOUNTER — Ambulatory Visit (INDEPENDENT_AMBULATORY_CARE_PROVIDER_SITE_OTHER): Payer: 59 | Admitting: Physician Assistant

## 2015-03-19 ENCOUNTER — Encounter: Payer: Self-pay | Admitting: Physician Assistant

## 2015-03-19 VITALS — BP 144/84 | HR 64 | Temp 97.6°F | Resp 16 | Ht 66.0 in | Wt 182.4 lb

## 2015-03-19 DIAGNOSIS — R600 Localized edema: Secondary | ICD-10-CM | POA: Diagnosis not present

## 2015-03-19 DIAGNOSIS — Z0000 Encounter for general adult medical examination without abnormal findings: Secondary | ICD-10-CM

## 2015-03-19 DIAGNOSIS — Z124 Encounter for screening for malignant neoplasm of cervix: Secondary | ICD-10-CM

## 2015-03-19 DIAGNOSIS — Z1239 Encounter for other screening for malignant neoplasm of breast: Secondary | ICD-10-CM

## 2015-03-19 DIAGNOSIS — B379 Candidiasis, unspecified: Secondary | ICD-10-CM | POA: Diagnosis not present

## 2015-03-19 MED ORDER — FUROSEMIDE 20 MG PO TABS: 20.0000 mg | ORAL_TABLET | Freq: Every day | ORAL | Status: DC

## 2015-03-19 MED ORDER — FLUCONAZOLE 150 MG PO TABS: 150.0000 mg | ORAL_TABLET | Freq: Once | ORAL | Status: DC
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
## 2015-03-19 NOTE — Patient Instructions (Signed)
Health Maintenance Adopting a healthy lifestyle and getting preventive care can go a long way to promote health and wellness. Talk with your health care provider about what schedule of regular examinations is right for you. This is a good chance for you to check in with your provider about disease prevention and staying healthy. In between checkups, there are plenty of things you can do on your own. Experts have done a lot of research about which lifestyle changes and preventive measures are most likely to keep you healthy. Ask your health care provider for more information. WEIGHT AND DIET  Eat a healthy diet 1. Be sure to include plenty of vegetables, fruits, low-fat dairy products, and lean protein. 2. Do not eat a lot of foods high in solid fats, added sugars, or salt. 3. Get regular exercise. This is one of the most important things you can do for your health. 1. Most adults should exercise for at least 150 minutes each week. The exercise should increase your heart rate and make you sweat (moderate-intensity exercise). 2. Most adults should also do strengthening exercises at least twice a week. This is in addition to the moderate-intensity exercise.  Maintain a healthy weight 1. Body mass index (BMI) is a measurement that can be used to identify possible weight problems. It estimates body fat based on height and weight. Your health care provider can help determine your BMI and help you achieve or maintain a healthy weight. 2. For females 6 years of age and older:  1. A BMI below 18.5 is considered underweight. 2. A BMI of 18.5 to 24.9 is normal. 3. A BMI of 25 to 29.9 is considered overweight. 4. A BMI of 30 and above is considered obese.  Watch levels of cholesterol and blood lipids 1. You should start having your blood tested for lipids and cholesterol at 54 years of age, then have this test every 5 years. 2. You may need to have your cholesterol levels checked more often if: 1. Your  lipid or cholesterol levels are high. 2. You are older than 54 years of age. 3. You are at high risk for heart disease.  CANCER SCREENING   Lung Cancer 1. Lung cancer screening is recommended for adults 7-54 years old who are at high risk for lung cancer because of a history of smoking. 2. A yearly low-dose CT scan of the lungs is recommended for people who: 1. Currently smoke. 2. Have quit within the past 15 years. 3. Have at least a 30-pack-year history of smoking. A pack year is smoking an average of one pack of cigarettes a day for 1 year. 3. Yearly screening should continue until it has been 15 years since you quit. 4. Yearly screening should stop if you develop a health problem that would prevent you from having lung cancer treatment.  Breast Cancer  Practice breast self-awareness. This means understanding how your breasts normally appear and feel.  It also means doing regular breast self-exams. Let your health care provider know about any changes, no matter how small.  If you are in your 20s or 30s, you should have a clinical breast exam (CBE) by a health care provider every 1-3 years as part of a regular health exam.  If you are 54 or older, have a CBE every year. Also consider having a breast X-Mad[place] (mammogram) every year.  If you have a family history of breast cancer, talk to your health care provider about genetic screening.  If you are  at high risk for breast cancer, talk to your health care provider about having an MRI and a mammogram every year.  Breast cancer gene (BRCA) assessment is recommended for women who have family members with BRCA-related cancers. BRCA-related cancers include:  Breast.  Ovarian.  Tubal.  Peritoneal cancers.  Results of the assessment will determine the need for genetic counseling and BRCA1 and BRCA2 testing. Cervical Cancer Routine pelvic examinations to screen for cervical cancer are no longer recommended for nonpregnant women who  are considered low risk for cancer of the pelvic organs (ovaries, uterus, and vagina) and who do not have symptoms. A pelvic examination may be necessary if you have symptoms including those associated with pelvic infections. Ask your health care provider if a screening pelvic exam is right for you.   The Pap test is the screening test for cervical cancer for women who are considered at risk.  If you had a hysterectomy for a problem that was not cancer or a condition that could lead to cancer, then you no longer need Pap tests.  If you are older than 54 years, and you have had normal Pap tests for the past 10 years, you no longer need to have Pap tests.  If you have had past treatment for cervical cancer or a condition that could lead to cancer, you need Pap tests and screening for cancer for at least 20 years after your treatment.  If you no longer get a Pap test, assess your risk factors if they change (such as having a new sexual partner). This can affect whether you should start being screened again.  Some women have medical problems that increase their chance of getting cervical cancer. If this is the case for you, your health care provider may recommend more frequent screening and Pap tests.  The human papillomavirus (HPV) test is another test that may be used for cervical cancer screening. The HPV test looks for the virus that can cause cell changes in the cervix. The cells collected during the Pap test can be tested for HPV.  The HPV test can be used to screen women 2 years of age and older. Getting tested for HPV can extend the interval between normal Pap tests from three to five years.  An HPV test also should be used to screen women of any age who have unclear Pap test results.  After 54 years of age, women should have HPV testing as often as Pap tests.  Colorectal Cancer  This type of cancer can be detected and often prevented.  Routine colorectal cancer screening usually  begins at 54 years of age and continues through 54 years of age.  Your health care provider may recommend screening at an earlier age if you have risk factors for colon cancer.  Your health care provider may also recommend using home test kits to check for hidden blood in the stool.  A small camera at the end of a tube can be used to examine your colon directly (sigmoidoscopy or colonoscopy). This is done to check for the earliest forms of colorectal cancer.  Routine screening usually begins at age 57.  Direct examination of the colon should be repeated every 5-10 years through 54 years of age. However, you may need to be screened more often if early forms of precancerous polyps or small growths are found. Skin Cancer  Check your skin from head to toe regularly.  Tell your health care provider about any new moles or changes in  moles, especially if there is a change in a mole's shape or color.  Also tell your health care provider if you have a mole that is larger than the size of a pencil eraser.  Always use sunscreen. Apply sunscreen liberally and repeatedly throughout the day.  Protect yourself by wearing long sleeves, pants, a wide-brimmed hat, and sunglasses whenever you are outside. HEART DISEASE, DIABETES, AND HIGH BLOOD PRESSURE   Have your blood pressure checked at least every 1-2 years. High blood pressure causes heart disease and increases the risk of stroke.  If you are between 32 years and 30 years old, ask your health care provider if you should take aspirin to prevent strokes.  Have regular diabetes screenings. This involves taking a blood sample to check your fasting blood sugar level.  If you are at a normal weight and have a low risk for diabetes, have this test once every three years after 54 years of age.  If you are overweight and have a high risk for diabetes, consider being tested at a younger age or more often. PREVENTING INFECTION  Hepatitis B  If you have a  higher risk for hepatitis B, you should be screened for this virus. You are considered at high risk for hepatitis B if:  You were born in a country where hepatitis B is common. Ask your health care provider which countries are considered high risk.  Your parents were born in a high-risk country, and you have not been immunized against hepatitis B (hepatitis B vaccine).  You have HIV or AIDS.  You use needles to inject street drugs.  You live with someone who has hepatitis B.  You have had sex with someone who has hepatitis B.  You get hemodialysis treatment.  You take certain medicines for conditions, including cancer, organ transplantation, and autoimmune conditions. Hepatitis C  Blood testing is recommended for:  Everyone born from [birth year] through 1965.  Anyone with known risk factors for hepatitis C. Sexually transmitted infections (STIs)  You should be screened for sexually transmitted infections (STIs) including gonorrhea and chlamydia if:  You are sexually active and are younger than 54 years of age.  You are older than 54 years of age and your health care provider tells you that you are at risk for this type of infection.  Your sexual activity has changed since you were last screened and you are at an increased risk for chlamydia or gonorrhea. Ask your health care provider if you are at risk.  If you do not have HIV, but are at risk, it may be recommended that you take a prescription medicine daily to prevent HIV infection. This is called pre-exposure prophylaxis (PrEP). You are considered at risk if:  You are sexually active and do not regularly use condoms or know the HIV status of your partner(s).  You take drugs by injection.  You are sexually active with a partner who has HIV. Talk with your health care provider about whether you are at high risk of being infected with HIV. If you choose to begin PrEP, you should first be tested for HIV. You should then be tested  every 3 months for as long as you are taking PrEP.  PREGNANCY   If you are premenopausal and you may become pregnant, ask your health care provider about preconception counseling.  If you may become pregnant, take 400 to 800 micrograms (mcg) of folic acid every day.  If you want to prevent pregnancy, talk to your  health care provider about birth control (contraception). OSTEOPOROSIS AND MENOPAUSE   Osteoporosis is a disease in which the bones lose minerals and strength with aging. This can result in serious bone fractures. Your risk for osteoporosis can be identified using a bone density scan.  If you are 76 years of age or older, or if you are at risk for osteoporosis and fractures, ask your health care provider if you should be screened.  Ask your health care provider whether you should take a calcium or vitamin D supplement to lower your risk for osteoporosis.  Menopause may have certain physical symptoms and risks.  Hormone replacement therapy may reduce some of these symptoms and risks. Talk to your health care provider about whether hormone replacement therapy is right for you.  HOME CARE INSTRUCTIONS   Schedule regular health, dental, and eye exams.  Stay current with your immunizations.   Do not use any tobacco products including cigarettes, chewing tobacco, or electronic cigarettes.  If you are pregnant, do not drink alcohol.  If you are breastfeeding, limit how much and how often you drink alcohol.  Limit alcohol intake to no more than 1 drink per day for nonpregnant women. One drink equals 12 ounces of beer, 5 ounces of wine, or 1 ounces of hard liquor.  Do not use street drugs.  Do not share needles.  Ask your health care provider for help if you need support or information about quitting drugs.  Tell your health care provider if you often feel depressed.  Tell your health care provider if you have ever been abused or do not feel safe at home. Document  Released: 02/15/2011 Document Revised: 12/17/2013 Document Reviewed: 07/04/2013 [facility] Patient Information 2015 [place], [place]. This information is not intended to replace advice given to you by your health care provider. Make sure you discuss any questions you have with your health care provider.  Fat and Cholesterol Control Diet Fat and cholesterol levels in your blood and organs are influenced by your diet. High levels of fat and cholesterol may lead to diseases of the heart, small and large blood vessels, gallbladder, liver, and pancreas. CONTROLLING FAT AND CHOLESTEROL WITH DIET Although exercise and lifestyle factors are important, your diet is key. That is because certain foods are known to raise cholesterol and others to lower it. The goal is to balance foods for their effect on cholesterol and more importantly, to replace saturated and trans fat with other types of fat, such as monounsaturated fat, polyunsaturated fat, and omega-3 fatty acids. On average, a person should consume no more than 15 to 17 g of saturated fat daily. Saturated and trans fats are considered "bad" fats, and they will raise LDL cholesterol. Saturated fats are primarily found in animal products such as meats, butter, and cream. However, that does not mean you need to give up all your favorite foods. Today, there are good tasting, low-fat, low-cholesterol substitutes for most of the things you like to eat. Choose low-fat or nonfat alternatives. Choose round or loin cuts of red meat. These types of cuts are lowest in fat and cholesterol. Chicken (without the skin), fish, veal, and ground [place] breast are great choices. Eliminate fatty meats, such as hot dogs and salami. Even shellfish have little or no saturated fat. Have a 3 oz (85 g) portion when you eat lean meat, poultry, or fish. Trans fats are also called "partially hydrogenated oils." They are oils that have been scientifically manipulated so that they are solid at  room temperature resulting in a longer shelf life and improved taste and texture of foods in which they are added. Trans fats are found in stick margarine, some tub margarines, cookies, crackers, and baked goods.  When baking and cooking, oils are a great substitute for butter. The monounsaturated oils are especially beneficial since it is believed they lower LDL and raise HDL. The oils you should avoid entirely are saturated tropical oils, such as coconut and palm.  Remember to eat a lot from food groups that are naturally free of saturated and trans fat, including fish, fruit, vegetables, beans, grains (barley, rice, couscous, bulgur wheat), and pasta (without cream sauces).  IDENTIFYING FOODS THAT LOWER FAT AND CHOLESTEROL  Soluble fiber may lower your cholesterol. This type of fiber is found in fruits such as apples, vegetables such as broccoli, potatoes, and carrots, legumes such as beans, peas, and lentils, and grains such as barley. Foods fortified with plant sterols (phytosterol) may also lower cholesterol. You should eat at least 2 g per day of these foods for a cholesterol lowering effect.  Read package labels to identify low-saturated fats, trans fat free, and low-fat foods at the supermarket. Select cheeses that have only 2 to 3 g saturated fat per ounce. Use a heart-healthy tub margarine that is free of trans fats or partially hydrogenated oil. When buying baked goods (cookies, crackers), avoid partially hydrogenated oils. Breads and muffins should be made from whole grains (whole-wheat or whole oat flour, instead of "flour" or "enriched flour"). Buy non-creamy canned soups with reduced salt and no added fats.  FOOD PREPARATION TECHNIQUES  Never deep-fry. If you must fry, either stir-fry, which uses very little fat, or use non-stick cooking sprays. When possible, broil, bake, or roast meats, and steam vegetables. Instead of putting butter or margarine on vegetables, use lemon and herbs,  applesauce, and cinnamon (for squash and sweet potatoes). Use nonfat yogurt, salsa, and low-fat dressings for salads.  LOW-SATURATED FAT / LOW-FAT FOOD SUBSTITUTES Meats / Saturated Fat (g) 4. Avoid: Steak, marbled (3 oz/85 g) / 11 g 5. Choose: Steak, lean (3 oz/85 g) / 4 g 3. Avoid: Hamburger (3 oz/85 g) / 7 g 4. Choose: Hamburger, lean (3 oz/85 g) / 5 g 3. Avoid: Ham (3 oz/85 g) / 6 g 4. Choose: Ham, lean cut (3 oz/85 g) / 2.4 g 5. Avoid: Chicken, with skin, dark meat (3 oz/85 g) / 4 g 6. Choose: Chicken, skin removed, dark meat (3 oz/85 g) / 2 g  Avoid: Chicken, with skin, light meat (3 oz/85 g) / 2.5 g  Choose: Chicken, skin removed, light meat (3 oz/85 g) / 1 g Dairy / Saturated Fat (g)  Avoid: Whole milk (1 cup) / 5 g  Choose: Low-fat milk, 2% (1 cup) / 3 g  Choose: Low-fat milk, 1% (1 cup) / 1.5 g  Choose: Skim milk (1 cup) / 0.3 g  Avoid: Hard cheese (1 oz/28 g) / 6 g  Choose: Skim milk cheese (1 oz/28 g) / 2 to 3 g  Avoid: Cottage cheese, 4% fat (1 cup) / 6.5 g  Choose: Low-fat cottage cheese, 1% fat (1 cup) / 1.5 g  Avoid: Ice cream (1 cup) / 9 g  Choose: Sherbet (1 cup) / 2.5 g  Choose: Nonfat frozen yogurt (1 cup) / 0.3 g  Choose: Frozen fruit bar / trace  Avoid: Whipped cream (1 tbs) / 3.5 g  Choose: Nondairy whipped topping (1 tbs) / 1 g Condiments /   Saturated Fat (g)  Avoid: Mayonnaise (1 tbs) / 2 g  Choose: Low-fat mayonnaise (1 tbs) / 1 g  Avoid: Butter (1 tbs) / 7 g  Choose: Extra light margarine (1 tbs) / 1 g  Avoid: Coconut oil (1 tbs) / 11.8 g  Choose: Olive oil (1 tbs) / 1.8 g  Choose: Corn oil (1 tbs) / 1.7 g  Choose: Safflower oil (1 tbs) / 1.2 g  Choose: Sunflower oil (1 tbs) / 1.4 g  Choose: Soybean oil (1 tbs) / 2.4 g  Choose: Canola oil (1 tbs) / 1 g Document Released: 08/02/2005 Document Revised: 11/27/2012 Document Reviewed: 10/31/2013 [employer] Patient Information 2015 [place], [place]. This information is not intended  to replace advice given to you by your health care provider. Make sure you discuss any questions you have with your health care provider.  American Heart Association (AHA) Exercise Recommendation  Being physically active is important to prevent heart disease and stroke, the nation's No. 1and No. 5killers. To improve overall cardiovascular health, we suggest at least 150 minutes per week of moderate exercise or 75 minutes per week of vigorous exercise (or a combination of moderate and vigorous activity). Thirty minutes a day, five times a week is an easy goal to remember. You will also experience benefits even if you divide your time into two or three segments of 10 to 15 minutes per day.  For people who would benefit from lowering their blood pressure or cholesterol, we recommend 40 minutes of aerobic exercise of moderate to vigorous intensity three to four times a week to lower the risk for heart attack and stroke.  Physical activity is anything that makes you move your body and burn calories.  This includes things like climbing stairs or playing sports. Aerobic exercises benefit your heart, and include walking, jogging, swimming or biking. Strength and stretching exercises are best for overall stamina and flexibility.  The simplest, positive change you can make to effectively improve your heart health is to start walking. It's enjoyable, free, easy, social and great exercise. A walking program is flexible and boasts high success rates because people can stick with it. It's easy for walking to become a regular and satisfying part of life.   For Overall Cardiovascular Health:  At least 30 minutes of moderate-intensity aerobic activity at least 5 days per week for a total of 150  OR   At least 25 minutes of vigorous aerobic activity at least 3 days per week for a total of 75 minutes; or a combination of moderate- and vigorous-intensity aerobic activity  AND   Moderate- to high-intensity  muscle-strengthening activity at least 2 days per week for additional health benefits.  For Lowering Blood Pressure and Cholesterol  An average 40 minutes of moderate- to vigorous-intensity aerobic activity 3 or 4 times per week  What if I can't make it to the time goal? Something is always better than nothing! And everyone has to start somewhere. Even if you've been sedentary for years, today is the day you can begin to make healthy changes in your life. If you don't think you'll make it for 30 or 40 minutes, set a reachable goal for today. You can work up toward your overall goal by increasing your time as you get stronger. Don't let all-or-nothing thinking rob you of doing what you can every day.  Source:http://www.heart.org

## 2015-03-24 LAB — PAP IG W/ RFLX HPV ASCU: PAP Smear Comment: 0

## 2015-03-25 ENCOUNTER — Telehealth: Payer: Self-pay

## 2015-03-25 NOTE — Telephone Encounter (Signed)
Patient advised as directed below. 

## 2015-03-25 NOTE — Telephone Encounter (Signed)
-----   Message from Margaretann Loveless, PA-C sent at 03/25/2015  8:27 AM EDT ----- Normal Pap.

## 2015-04-02 ENCOUNTER — Ambulatory Visit: Payer: 59

## 2015-04-03 ENCOUNTER — Ambulatory Visit: Payer: Self-pay | Admitting: Family Medicine

## 2015-04-17 ENCOUNTER — Ambulatory Visit (INDEPENDENT_AMBULATORY_CARE_PROVIDER_SITE_OTHER): Payer: 59 | Admitting: Physician Assistant

## 2015-04-17 ENCOUNTER — Encounter: Payer: Self-pay | Admitting: Physician Assistant

## 2015-04-17 VITALS — BP 132/78 | HR 66 | Temp 98.2°F | Resp 16 | Wt 174.6 lb

## 2015-04-17 DIAGNOSIS — F17200 Nicotine dependence, unspecified, uncomplicated: Secondary | ICD-10-CM

## 2015-04-17 DIAGNOSIS — I10 Essential (primary) hypertension: Secondary | ICD-10-CM

## 2015-04-17 DIAGNOSIS — E538 Deficiency of other specified B group vitamins: Secondary | ICD-10-CM

## 2015-04-17 DIAGNOSIS — R609 Edema, unspecified: Secondary | ICD-10-CM

## 2015-04-17 DIAGNOSIS — R7303 Prediabetes: Secondary | ICD-10-CM

## 2015-04-17 DIAGNOSIS — R7309 Other abnormal glucose: Secondary | ICD-10-CM | POA: Diagnosis not present

## 2015-04-17 DIAGNOSIS — E7800 Pure hypercholesterolemia, unspecified: Secondary | ICD-10-CM

## 2015-04-17 DIAGNOSIS — E559 Vitamin D deficiency, unspecified: Secondary | ICD-10-CM

## 2015-04-17 DIAGNOSIS — R600 Localized edema: Secondary | ICD-10-CM

## 2015-04-17 MED ORDER — CYANOCOBALAMIN 1000 MCG/ML IJ SOLN
1000.0000 ug | Freq: Once | INTRAMUSCULAR | Status: AC
  Administered 2015-04-17: 1000 ug via INTRAMUSCULAR
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
## 2015-04-17 NOTE — Patient Instructions (Signed)

## 2015-04-18 LAB — CBC WITH DIFFERENTIAL/PLATELET
BASOS ABS: 0 10*3/uL (ref 0.0–0.2)
Basos: 1 %
EOS (ABSOLUTE): 0.1 10*3/uL (ref 0.0–0.4)
Eos: 1 %
HEMOGLOBIN: 15.1 g/dL (ref 11.1–15.9)
Hematocrit: 45.3 % (ref 34.0–46.6)
Immature Grans (Abs): 0 10*3/uL (ref 0.0–0.1)
Immature Granulocytes: 0 %
LYMPHS ABS: 2.7 10*3/uL (ref 0.7–3.1)
Lymphs: 37 %
MCH: 30.9 pg (ref 26.6–33.0)
MCHC: 33.3 g/dL (ref 31.5–35.7)
MCV: 93 fL (ref 79–97)
MONOCYTES: 5 %
MONOS ABS: 0.4 10*3/uL (ref 0.1–0.9)
Neutrophils Absolute: 4.1 10*3/uL (ref 1.4–7.0)
Neutrophils: 56 %
Platelets: 214 10*3/uL (ref 150–379)
RBC: 4.89 x10E6/uL (ref 3.77–5.28)
RDW: 14.1 % (ref 12.3–15.4)
WBC: 7.3 10*3/uL (ref 3.4–10.8)

## 2015-04-18 LAB — COMPREHENSIVE METABOLIC PANEL
ALK PHOS: 83 IU/L (ref 39–117)
ALT: 18 IU/L (ref 0–32)
AST: 18 IU/L (ref 0–40)
Albumin/Globulin Ratio: 2 (ref 1.1–2.5)
Albumin: 5.2 g/dL (ref 3.5–5.5)
BUN/Creatinine Ratio: 12 (ref 9–23)
BUN: 12 mg/dL (ref 6–24)
Bilirubin Total: 0.6 mg/dL (ref 0.0–1.2)
CO2: 22 mmol/L (ref 18–29)
CREATININE: 1.01 mg/dL — AB (ref 0.57–1.00)
Calcium: 9.9 mg/dL (ref 8.7–10.2)
Chloride: 98 mmol/L (ref 97–108)
GFR calc Af Amer: 73 mL/min/{1.73_m2} (ref >59)
GFR calc non Af Amer: 64 mL/min/{1.73_m2} (ref >59)
GLUCOSE: 95 mg/dL (ref 65–99)
Globulin, Total: 2.6 g/dL (ref 1.5–4.5)
Potassium: 4.6 mmol/L (ref 3.5–5.2)
Sodium: 139 mmol/L (ref 134–144)
Total Protein: 7.8 g/dL (ref 6.0–8.5)

## 2015-04-18 LAB — LIPID PANEL
CHOLESTEROL TOTAL: 204 mg/dL — AB (ref 100–199)
Chol/HDL Ratio: 4.3 ratio units (ref 0.0–4.4)
HDL: 48 mg/dL (ref >39)
LDL CALC: 120 mg/dL — AB (ref 0–99)
TRIGLYCERIDES: 182 mg/dL — AB (ref 0–149)
VLDL CHOLESTEROL CAL: 36 mg/dL (ref 5–40)

## 2015-04-18 LAB — HEMOGLOBIN A1C
Est. average glucose Bld gHb Est-mCnc: 114 mg/dL
HEMOGLOBIN A1C: 5.6 % (ref 4.8–5.6)

## 2015-04-18 LAB — VITAMIN D 25 HYDROXY (VIT D DEFICIENCY, FRACTURES): Vit D, 25-Hydroxy: 27.9 ng/mL — ABNORMAL LOW (ref 30.0–100.0)

## 2015-04-18 LAB — VITAMIN B12

## 2015-04-24 ENCOUNTER — Telehealth: Payer: Self-pay

## 2015-04-24 NOTE — Telephone Encounter (Signed)
-----   Message from Margaretann Loveless, PA-C sent at 04/23/2015  8:57 AM EDT ----- HgBA1c has improved from 6.1 to 5.6.  Cont Vit D supplement as it is still slightly low at 27.9.  Vit B level is abnormally high most likely because of Vit B12 injection prior to lab draw.  Blood counts, kidney function and liver function are all WNL.  Cholesterol is borderline high.  Total is 204 (like below 200), Triglycerides are 182 (like below 150), and LDL is 120 (like below 100).  HDL is 48 which is good.

## 2015-04-24 NOTE — Telephone Encounter (Signed)
LMTCB -04/24/15  Thanks,  -Jeweldean Drohan

## 2015-04-25 NOTE — Telephone Encounter (Signed)
Patient advised as directed below.  Thanks,  -Joseline 

## 2015-05-01 ENCOUNTER — Other Ambulatory Visit: Payer: Self-pay | Admitting: Family Medicine

## 2015-05-01 DIAGNOSIS — F411 Generalized anxiety disorder: Secondary | ICD-10-CM

## 2015-05-01 NOTE — Telephone Encounter (Signed)
Ok to call in rx.  Thanks.  

## 2015-05-19 ENCOUNTER — Ambulatory Visit (INDEPENDENT_AMBULATORY_CARE_PROVIDER_SITE_OTHER): Payer: 59

## 2015-05-19 DIAGNOSIS — E538 Deficiency of other specified B group vitamins: Secondary | ICD-10-CM

## 2015-05-19 MED ORDER — CYANOCOBALAMIN 1000 MCG/ML IJ SOLN
1000.0000 ug | Freq: Once | INTRAMUSCULAR | Status: AC
  Administered 2015-05-19: 1000 ug via INTRAMUSCULAR

## 2015-05-19 NOTE — Progress Notes (Signed)
B 12 injection at office today. Tolerated well. Will come back in 4 weeks per MD's order.   

## 2015-06-20 ENCOUNTER — Ambulatory Visit (INDEPENDENT_AMBULATORY_CARE_PROVIDER_SITE_OTHER): Payer: 59

## 2015-06-20 DIAGNOSIS — E538 Deficiency of other specified B group vitamins: Secondary | ICD-10-CM | POA: Diagnosis not present

## 2015-06-20 MED ORDER — CYANOCOBALAMIN 1000 MCG/ML IJ SOLN
1000.0000 ug | Freq: Once | INTRAMUSCULAR | Status: AC
  Administered 2015-06-20: 1000 ug via INTRAMUSCULAR

## 2015-06-20 NOTE — Progress Notes (Signed)
Patient came in office today to receive her monthly injection for Vitamin B12.

## 2015-07-14 ENCOUNTER — Other Ambulatory Visit: Payer: Self-pay | Admitting: Family Medicine

## 2015-07-14 ENCOUNTER — Other Ambulatory Visit: Payer: Self-pay | Admitting: Physician Assistant

## 2015-07-18 ENCOUNTER — Ambulatory Visit (INDEPENDENT_AMBULATORY_CARE_PROVIDER_SITE_OTHER): Payer: 59 | Admitting: Family Medicine

## 2015-07-18 ENCOUNTER — Ambulatory Visit (INDEPENDENT_AMBULATORY_CARE_PROVIDER_SITE_OTHER): Payer: 59

## 2015-07-18 ENCOUNTER — Encounter: Payer: Self-pay | Admitting: Family Medicine

## 2015-07-18 VITALS — BP 122/64 | HR 47 | Temp 98.3°F | Resp 16 | Wt 181.2 lb

## 2015-07-18 DIAGNOSIS — E538 Deficiency of other specified B group vitamins: Secondary | ICD-10-CM

## 2015-07-18 DIAGNOSIS — R001 Bradycardia, unspecified: Secondary | ICD-10-CM | POA: Diagnosis not present

## 2015-07-18 MED ORDER — CYANOCOBALAMIN 1000 MCG/ML IJ SOLN
1000.0000 ug | Freq: Once | INTRAMUSCULAR | Status: AC
  Administered 2015-07-18: 1000 ug via INTRAMUSCULAR

## 2015-07-18 MED ORDER — PROPRANOLOL HCL ER BEADS 80 MG PO CP24: 80.0000 mg | ORAL_CAPSULE | Freq: Every day | ORAL | Status: DC
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
## 2015-07-18 NOTE — Patient Instructions (Signed)
Start 80 mg.of inderal tomorrow. Skip your dose of 120 tonight.

## 2015-07-24 ENCOUNTER — Encounter: Payer: Self-pay | Admitting: Physician Assistant

## 2015-07-24 ENCOUNTER — Other Ambulatory Visit: Payer: Self-pay | Admitting: Physician Assistant

## 2015-07-24 ENCOUNTER — Ambulatory Visit (INDEPENDENT_AMBULATORY_CARE_PROVIDER_SITE_OTHER): Payer: 59 | Admitting: Physician Assistant

## 2015-07-24 VITALS — BP 138/80 | HR 59 | Temp 98.0°F | Resp 16 | Wt 177.0 lb

## 2015-07-24 DIAGNOSIS — E559 Vitamin D deficiency, unspecified: Secondary | ICD-10-CM

## 2015-07-24 DIAGNOSIS — I10 Essential (primary) hypertension: Secondary | ICD-10-CM | POA: Diagnosis not present

## 2015-07-24 DIAGNOSIS — R001 Bradycardia, unspecified: Secondary | ICD-10-CM

## 2015-07-24 MED ORDER — HYDROCHLOROTHIAZIDE 12.5 MG PO CAPS: 12.5000 mg | ORAL_CAPSULE | Freq: Every day | ORAL | Status: DC

## 2015-07-24 NOTE — Patient Instructions (Signed)
Bradycardia Bradycardia is a slower-than-normal heart rate. A normal resting heart rate for an adult ranges from 60 to 100 beats per minute. With bradycardia, the resting heart rate is less than 60 beats per minute. Bradycardia is a problem if your heart cannot pump enough oxygen-rich blood through your body. Bradycardia is not a problem for everyone. For some healthy adults, a slow resting heart rate is normal.  CAUSES  Bradycardia may be caused by:  A problem with the heart's electrical system, such as heart block.  A problem with the heart's natural pacemaker (sinus node).  Heart disease, damage, or infection.  Certain medicines that treat heart conditions.  Certain conditions, such as hypothyroidism and obstructive sleep apnea. RISK FACTORS  Risk factors include:  Being 96 or older.  Having high blood pressure (hypertension), high cholesterol (hyperlipidemia), or diabetes.  Drinking heavily, using tobacco products, or using drugs.  Being stressed. SIGNS AND SYMPTOMS  Signs and symptoms include:  Light-headedness.  Faintingor near fainting.  Fatigue and weakness.  Shortness of breath.  Chest pain (angina).  Drowsiness.  Confusion.  Dizziness. DIAGNOSIS  Diagnosis of bradycardia may include:  A physical exam.  An electrocardiogram (ECG).  Blood tests. TREATMENT  Treatment for bradycardia may include:  Treatment of an underlying condition.  Pacemaker placement. A pacemaker is a small, battery-powered device that is placed under the skin and is programmed to sense your heartbeats. If your heart rate is lower than the programmed rate, the pacemaker will pace your heart.  Changing your medicines or dosages. HOME CARE INSTRUCTIONS  Take medicines only as directed by your health care provider.  Manage any health conditions that contribute to bradycardia as directed by your health care provider.  Follow a heart-healthy diet. A dietitian can help educate  you on healthy food options and changes.  Follow an exercise program approved by your health care provider.  Maintain a healthy weight. Lose weight as approved by your health care provider.  Do not use tobacco products, including cigarettes, chewing tobacco, or electronic cigarettes. If you need help quitting, ask your health care provider.  Do not use illegal drugs.  Limit alcohol intake to no more than 1 drink per day for nonpregnant women and 2 drinks per day for men. One drink equals 12 ounces of beer, 5 ounces of wine, or 1 ounces of hard liquor.  Keep all follow-up visits as directed by your health care provider. This is important. SEEK MEDICAL CARE IF:  You feel light-headed or dizzy.  You almost faint.  You feel weak or are easily fatigued during physical activity.  You experience confusion or have memory problems. SEEK IMMEDIATE MEDICAL CARE IF:   You faint.  You have an irregular heartbeat.  You have chest pain.  You have trouble breathing. MAKE SURE YOU:   Understand these instructions.  Will watch your condition.  Will get help right away if you are not doing well or get worse.   This information is not intended to replace advice given to you by your health care provider. Make sure you discuss any questions you have with your health care provider.   Document Released: 04/24/2002 Document Revised: 08/23/2014 Document Reviewed: 11/07/2013 Elsevier Interactive Patient Education 2016 Elsevier Inc.  Hydrochlorothiazide, HCTZ capsules or tablets What is this medicine? HYDROCHLOROTHIAZIDE (hye droe klor oh THYE a zide) is a diuretic. It increases the amount of urine passed, which causes the body to lose salt and water. This medicine is used to treat high blood  pressure. It is also reduces the swelling and water retention caused by various medical conditions, such as heart, liver, or kidney disease. This medicine may be used for other purposes; ask your health  care provider or pharmacist if you have questions. What should I tell my health care provider before I take this medicine? They need to know if you have any of these conditions: -diabetes -gout -immune system problems, like lupus -kidney disease or kidney stones -liver disease -pancreatitis -small amount of urine or difficulty passing urine -an unusual or allergic reaction to hydrochlorothiazide, sulfa drugs, other medicines, foods, dyes, or preservatives -pregnant or trying to get pregnant -breast-feeding How should I use this medicine? Take this medicine by mouth with a glass of water. Follow the directions on the prescription label. Take your medicine at regular intervals. Remember that you will need to pass urine frequently after taking this medicine. Do not take your doses at a time of day that will cause you problems. Do not stop taking your medicine unless your doctor tells you to. Talk to your pediatrician regarding the use of this medicine in children. Special care may be needed. Overdosage: If you think you have taken too much of this medicine contact a poison control center or emergency room at once. NOTE: This medicine is only for you. Do not share this medicine with others. What if I miss a dose? If you miss a dose, take it as soon as you can. If it is almost time for your next dose, take only that dose. Do not take double or extra doses. What may interact with this medicine? -cholestyramine -colestipol -digoxin -dofetilide -lithium -medicines for blood pressure -medicines for diabetes -medicines that relax muscles for surgery -other diuretics -steroid medicines like prednisone or cortisone This list may not describe all possible interactions. Give your health care provider a list of all the medicines, herbs, non-prescription drugs, or dietary supplements you use. Also tell them if you smoke, drink alcohol, or use illegal drugs. Some items may interact with your  medicine. What should I watch for while using this medicine? Visit your doctor or health care professional for regular checks on your progress. Check your blood pressure as directed. Ask your doctor or health care professional what your blood pressure should be and when you should contact him or her. You may need to be on a special diet while taking this medicine. Ask your doctor. Check with your doctor or health care professional if you get an attack of severe diarrhea, nausea and vomiting, or if you sweat a lot. The loss of too much body fluid can make it dangerous for you to take this medicine. You may get drowsy or dizzy. Do not drive, use machinery, or do anything that needs mental alertness until you know how this medicine affects you. Do not stand or sit up quickly, especially if you are an older patient. This reduces the risk of dizzy or fainting spells. Alcohol may interfere with the effect of this medicine. Avoid alcoholic drinks. This medicine may affect your blood sugar level. If you have diabetes, check with your doctor or health care professional before changing the dose of your diabetic medicine. This medicine can make you more sensitive to the sun. Keep out of the sun. If you cannot avoid being in the sun, wear protective clothing and use sunscreen. Do not use sun lamps or tanning beds/booths. What side effects may I notice from receiving this medicine? Side effects that you should report to  your doctor or health care professional as soon as possible: -allergic reactions such as skin rash or itching, hives, swelling of the lips, mouth, tongue, or throat -changes in vision -chest pain -eye pain -fast or irregular heartbeat -feeling faint or lightheaded, falls -gout attack -muscle pain or cramps -pain or difficulty when passing urine -pain, tingling, numbness in the hands or feet -redness, blistering, peeling or loosening of the skin, including inside the mouth -unusually weak or  tired Side effects that usually do not require medical attention (report to your doctor or health care professional if they continue or are bothersome): -change in sex drive or performance -dry mouth -headache -stomach upset This list may not describe all possible side effects. Call your doctor for medical advice about side effects. You may report side effects to FDA at 1-800-FDA-1088. Where should I keep my medicine? Keep out of the reach of children. Store at room temperature between 15 and 30 degrees C (59 and 86 degrees F). Do not freeze. Protect from light and moisture. Keep container closed tightly. Throw away any unused medicine after the expiration date. NOTE: This sheet is a summary. It may not cover all possible information. If you have questions about this medicine, talk to your doctor, pharmacist, or health care provider.    2016, Elsevier/Gold Standard. (2010-03-27 12:57:37)

## 2015-07-24 NOTE — Progress Notes (Signed)
Patient: Lisa Alvarado Female    DOB: 12-Oct-1960   54 y.o.   MRN: [id number] Visit Date: 07/24/2015  Today's Provider: Margaretann LovelessJennifer M Burnette, PA-C   Chief Complaint  Patient presents with  . Follow-up    Bradycardia   Subjective:    HPI Irregular Heart Beat: Lisa Alvarado is a 54 year old following up on her irregular heart beat. Patient was seen on 12/02 for bradycardia. Patient's symptoms are fatigue, dizziness, and headache. Patient feels like her blood pressure is up however with the decrease in Inderal that was made at previous visit.     Allergies  Allergen Reactions  . Sulfa Antibiotics   . Sulfur Other (See Comments)  . Sulfamethoxazole-Trimethoprim Rash   Previous Medications   ALPRAZOLAM (XANAX) 0.5 MG TABLET    TAKE 1 TABLET BY MOUTH EVERY 8 HOURS AS NEEDED OR AT BEDTIME   CYANOCOBALAMIN (,VITAMIN B-12,) 1000 MCG/ML INJECTION    Inject into the muscle.   ESOMEPRAZOLE (NEXIUM) 40 MG CAPSULE    Take 1 capsule (40 mg total) by mouth 2 (two) times daily before a meal.   FLUCONAZOLE (DIFLUCAN) 150 MG TABLET    Take 1 tablet (150 mg total) by mouth once.   FUROSEMIDE (LASIX) 20 MG TABLET    Take 1 tablet (20 mg total) by mouth daily.   LEVONORGESTREL (MIRENA) 20 MCG/24HR IUD    by Intrauterine route.   LORATADINE (CLARITIN) 10 MG TABLET    1 tablet daily.   MELOXICAM (MOBIC) 7.5 MG TABLET    TAKE 1 TABLET(7.5 MG) BY MOUTH TWICE DAILY   MONTELUKAST (SINGULAIR) 10 MG TABLET    Take by mouth.   PHOSPHATIDYLSERINE-DHA-EPA 75-21.5-8.5 MG CAPS    Take by mouth.   PROPRANOLOL (INNOPRAN XL) 80 MG 24 HR CAPSULE    Take 1 capsule (80 mg total) by mouth at bedtime.   SERTRALINE (ZOLOFT) 100 MG TABLET    Take 150 mg by mouth daily.   SIMVASTATIN (ZOCOR) 20 MG TABLET    Take 1 tablet (20 mg total) by mouth daily.   VITAMIN D, ERGOCALCIFEROL, (DRISDOL) 50000 UNITS CAPS CAPSULE    Take 50,000 Units by mouth every 30 (thirty) days.    Review of Systems  Constitutional:  Positive for fatigue. Negative for fever and chills.  Eyes: Positive for visual disturbance (double vision when fatigued after work).  Respiratory: Positive for chest tightness. Negative for cough, shortness of breath and wheezing.   Cardiovascular: Negative for chest pain, palpitations (Low heart rate) and leg swelling.  Gastrointestinal: Negative.   Neurological: Positive for dizziness, light-headedness and headaches. Negative for syncope.    Social History  Substance Use Topics  . Smoking status: Current Every Day Smoker    Types: E-cigarettes  . Smokeless tobacco: Not on file  . Alcohol Use: No   Objective:   BP 138/80 mmHg  Pulse 59  Temp(Src) 98 F (36.7 C) (Oral)  Resp 16  Wt 177 lb (80.287 kg)  Physical Exam  Constitutional: She appears well-developed and well-nourished. No distress.  HENT:  Head: Normocephalic and atraumatic.  Right Ear: Hearing, tympanic membrane, external ear and ear canal normal.  Left Ear: Hearing, tympanic membrane, external ear and ear canal normal.  Nose: Nose normal.  Mouth/Throat: Uvula is midline, oropharynx is clear and moist and mucous membranes are normal. No oropharyngeal exudate.  Eyes: Conjunctivae are normal. Pupils are equal, round, and reactive to light. Right eye exhibits no discharge.  Left eye exhibits no discharge. No scleral icterus.  Neck: Normal range of motion. Neck supple. No tracheal deviation present. No thyromegaly present.  Cardiovascular: Regular rhythm and intact distal pulses.  Bradycardia present.  Exam reveals no gallop and no friction rub.   Murmur heard.  Systolic murmur is present with a grade of 2/6  Pulmonary/Chest: Effort normal and breath sounds normal. No stridor. No respiratory distress. She has no wheezes. She has no rales.  Lymphadenopathy:    She has no cervical adenopathy.  Neurological: She has normal strength. No cranial nerve deficit or sensory deficit. She displays a negative Romberg sign.  Coordination and gait normal.  Skin: Skin is warm and dry. She is not diaphoretic.  Vitals reviewed.       Assessment & Plan:     1. Benign essential HTN Due to the increased bradycardia and fatigue I will discontinue the Inderal 80 mg and the furosemide 20 mg. I will then start her on HCTZ 12.5 mg for her blood pressure. She is to call the office if she has any adverse reactions or worsening symptoms with these medicine changes. I will also check labs as below to make sure there is no secondary cause of the bradycardia and fatigue. I will see her back in 3 weeks to recheck her heart rate and blood pressure. If her heart rate is still decreased with the medication changes we will also obtain and EKG when she returns. - hydrochlorothiazide (MICROZIDE) 12.5 MG capsule; Take 1 capsule (12.5 mg total) by mouth daily.  Dispense: 30 capsule; Refill: 0 - CBC with Differential - Comprehensive Metabolic Panel (CMET) - TSH  2. Bradycardia See above medical treatment plan. - CBC with Differential - Comprehensive Metabolic Panel (CMET) - TSH       Margaretann Loveless, PA-C  [facility]

## 2015-08-15 ENCOUNTER — Encounter: Payer: Self-pay | Admitting: Physician Assistant

## 2015-08-15 ENCOUNTER — Other Ambulatory Visit: Payer: Self-pay | Admitting: Physician Assistant

## 2015-08-15 ENCOUNTER — Ambulatory Visit (INDEPENDENT_AMBULATORY_CARE_PROVIDER_SITE_OTHER): Payer: 59 | Admitting: Physician Assistant

## 2015-08-15 ENCOUNTER — Ambulatory Visit: Payer: 59

## 2015-08-15 VITALS — BP 128/72 | HR 70 | Temp 99.1°F | Resp 14 | Wt 176.0 lb

## 2015-08-15 DIAGNOSIS — I10 Essential (primary) hypertension: Secondary | ICD-10-CM

## 2015-08-15 DIAGNOSIS — K219 Gastro-esophageal reflux disease without esophagitis: Secondary | ICD-10-CM

## 2015-08-15 DIAGNOSIS — F32A Depression, unspecified: Secondary | ICD-10-CM

## 2015-08-15 DIAGNOSIS — F329 Major depressive disorder, single episode, unspecified: Secondary | ICD-10-CM | POA: Diagnosis not present

## 2015-08-15 DIAGNOSIS — E538 Deficiency of other specified B group vitamins: Secondary | ICD-10-CM

## 2015-08-15 DIAGNOSIS — M26629 Arthralgia of temporomandibular joint, unspecified side: Secondary | ICD-10-CM

## 2015-08-15 MED ORDER — HYDROCHLOROTHIAZIDE 25 MG PO TABS: 25.0000 mg | ORAL_TABLET | Freq: Every day | ORAL | Status: DC

## 2015-08-15 MED ORDER — MELOXICAM 7.5 MG PO TABS: 7.5000 mg | ORAL_TABLET | Freq: Two times a day (BID) | ORAL | Status: DC

## 2015-08-15 MED ORDER — ESOMEPRAZOLE MAGNESIUM 40 MG PO CPDR
40.0000 mg | DELAYED_RELEASE_CAPSULE | Freq: Every day | ORAL | Status: DC
Start: 2015-08-15 — End: 2016-10-01

## 2015-08-15 MED ORDER — CYANOCOBALAMIN 1000 MCG/ML IJ SOLN
1000.0000 ug | Freq: Once | INTRAMUSCULAR | Status: AC
  Administered 2015-08-15: 1000 ug via INTRAMUSCULAR

## 2015-08-15 MED ORDER — SERTRALINE HCL 100 MG PO TABS: 100.0000 mg | ORAL_TABLET | Freq: Every day | ORAL | Status: DC

## 2015-08-15 NOTE — Patient Instructions (Signed)
Hypertension Hypertension, commonly called high blood pressure, is when the force of blood pumping through your arteries is too strong. Your arteries are the blood vessels that carry blood from your heart throughout your body. A blood pressure reading consists of a higher number over a lower number, such as 110/72. The higher number (systolic) is the pressure inside your arteries when your heart pumps. The lower number (diastolic) is the pressure inside your arteries when your heart relaxes. Ideally you want your blood pressure below 120/80. Hypertension forces your heart to work harder to pump blood. Your arteries may become narrow or stiff. Having untreated or uncontrolled hypertension can cause heart attack, stroke, kidney disease, and other problems. RISK FACTORS Some risk factors for high blood pressure are controllable. Others are not.  Risk factors you cannot control include:   Race. You may be at higher risk if you are African American.  Age. Risk increases with age.  Gender. Men are at higher risk than women before age 45 years. After age 65, women are at higher risk than men. Risk factors you can control include:  Not getting enough exercise or physical activity.  Being overweight.  Getting too much fat, sugar, calories, or salt in your diet.  Drinking too much alcohol. SIGNS AND SYMPTOMS Hypertension does not usually cause signs or symptoms. Extremely high blood pressure (hypertensive crisis) may cause headache, anxiety, shortness of breath, and nosebleed. DIAGNOSIS To check if you have hypertension, your health care provider will measure your blood pressure while you are seated, with your arm held at the level of your heart. It should be measured at least twice using the same arm. Certain conditions can cause a difference in blood pressure between your right and left arms. A blood pressure reading that is higher than normal on one occasion does not mean that you need treatment. If  it is not clear whether you have high blood pressure, you may be asked to return on a different day to have your blood pressure checked again. Or, you may be asked to monitor your blood pressure at home for 1 or more weeks. TREATMENT Treating high blood pressure includes making lifestyle changes and possibly taking medicine. Living a healthy lifestyle can help lower high blood pressure. You may need to change some of your habits. Lifestyle changes may include:  Following the DASH diet. This diet is high in fruits, vegetables, and whole grains. It is low in salt, red meat, and added sugars.  Keep your sodium intake below 2,300 mg per day.  Getting at least 30-45 minutes of aerobic exercise at least 4 times per week.  Losing weight if necessary.  Not smoking.  Limiting alcoholic beverages.  Learning ways to reduce stress. Your health care provider may prescribe medicine if lifestyle changes are not enough to get your blood pressure under control, and if one of the following is true:  You are 18-59 years of age and your systolic blood pressure is above 140.  You are 60 years of age or older, and your systolic blood pressure is above 150.  Your diastolic blood pressure is above 90.  You have diabetes, and your systolic blood pressure is over 140 or your diastolic blood pressure is over 90.  You have kidney disease and your blood pressure is above 140/90.  You have heart disease and your blood pressure is above 140/90. Your personal target blood pressure may vary depending on your medical conditions, your age, and other factors. HOME CARE INSTRUCTIONS    Have your blood pressure rechecked as directed by your health care provider.   Take medicines only as directed by your health care provider. Follow the directions carefully. Blood pressure medicines must be taken as prescribed. The medicine does not work as well when you skip doses. Skipping doses also puts you at risk for  problems.  Do not smoke.   Monitor your blood pressure at home as directed by your health care provider. SEEK MEDICAL CARE IF:   You think you are having a reaction to medicines taken.  You have recurrent headaches or feel dizzy.  You have swelling in your ankles.  You have trouble with your vision. SEEK IMMEDIATE MEDICAL CARE IF:  You develop a severe headache or confusion.  You have unusual weakness, numbness, or feel faint.  You have severe chest or abdominal pain.  You vomit repeatedly.  You have trouble breathing. MAKE SURE YOU:   Understand these instructions.  Will watch your condition.  Will get help right away if you are not doing well or get worse.   This information is not intended to replace advice given to you by your health care provider. Make sure you discuss any questions you have with your health care provider.   Document Released: 08/02/2005 Document Revised: 12/17/2014 Document Reviewed: 05/25/2013 Elsevier Interactive Patient Education 2016 [employer] Inc[employer]  

## 2015-08-15 NOTE — Progress Notes (Signed)
Patient ID: Lisa Alvarado, female   DOB: Feb 17, 1961, 54 y.o.  Lisa Alvarado MRN: [id number]       Patient: Lisa DroneCheryl A Alvarado Female    DOB: Feb 17, 1961   54 y.o.   MRN: [id number] Visit Date: 08/15/2015  Today's Provider: Margaretann LovelessJennifer M Mylene Bow, PA-C   Chief Complaint  Patient presents with  . Hypertension  . B12 Injection   Subjective:    HPI  Patient is here for follow up. Her last office visit was on December 8th and at that time due to bradycardia and fatigue we stopped Inderal and Lasix and started her on HCTZ. Labs: CBC, MetC , TSH and patient got this done this morning before her appointment. She has checked her B/P and feels like it is running higher now-readings have been as high as 170s/90s-100. Pulse has been about 60 when she checks it. Fatigue is the same no change in that per patient.  BP Readings from Last 3 Encounters:  08/15/15 128/72  07/24/15 138/80  07/18/15 122/64   She also has had more headaches when she is at work not quite migraines but still bother her. She states this happens about 7 days out of 2 weeks when she works. She thinks it is related to the b/p.  She is also due for B12 injection today.    Allergies  Allergen Reactions  . Sulfa Antibiotics   . Sulfur Other (See Comments)  . Sulfamethoxazole-Trimethoprim Rash   Previous Medications   ALPRAZOLAM (XANAX) 0.5 MG TABLET    TAKE 1 TABLET BY MOUTH EVERY 8 HOURS AS NEEDED OR AT BEDTIME   CYANOCOBALAMIN (,VITAMIN B-12,) 1000 MCG/ML INJECTION    Inject into the muscle.   ESOMEPRAZOLE (NEXIUM) 40 MG CAPSULE    Take 1 capsule (40 mg total) by mouth 2 (two) times daily before a meal.   HYDROCHLOROTHIAZIDE (MICROZIDE) 12.5 MG CAPSULE    Take 1 capsule (12.5 mg total) by mouth daily.   LEVONORGESTREL (MIRENA) 20 MCG/24HR IUD    by Intrauterine route.   LORATADINE (CLARITIN) 10 MG TABLET    1 tablet daily.   MELOXICAM (MOBIC) 7.5 MG TABLET    TAKE 1 TABLET(7.5 MG) BY MOUTH TWICE DAILY   MONTELUKAST (SINGULAIR) 10 MG  TABLET    Take by mouth.   SERTRALINE (ZOLOFT) 100 MG TABLET    Take 150 mg by mouth daily.   SIMVASTATIN (ZOCOR) 20 MG TABLET    Take 1 tablet (20 mg total) by mouth daily.   VITAMIN D, ERGOCALCIFEROL, (DRISDOL) 50000 UNITS CAPS CAPSULE    Take 1 capsule (50,000 Units total) by mouth every 7 (seven) days.    Review of Systems  Constitutional: Positive for fatigue. Negative for chills, activity change and appetite change.  Respiratory: Negative.   Cardiovascular: Negative.   Gastrointestinal: Negative.   Musculoskeletal: Negative.   Neurological: Positive for headaches.    Social History  Substance Use Topics  . Smoking status: Current Every Day Smoker    Types: E-cigarettes  . Smokeless tobacco: Never Used  . Alcohol Use: No   Objective:   BP 128/72 mmHg  Pulse 70  Temp(Src) 99.1 F (37.3 C)  Resp 14  Wt 176 lb (79.833 kg)  Physical Exam  Constitutional: She appears well-developed and well-nourished. No distress.  Neck: Normal range of motion. Neck supple. No tracheal deviation present. No thyromegaly present.  Cardiovascular: Normal rate, regular rhythm and normal heart sounds.  Exam reveals no gallop and no friction rub.   No  murmur heard. Pulmonary/Chest: Effort normal and breath sounds normal. No respiratory distress. She has no wheezes. She has no rales.  Lymphadenopathy:    She has no cervical adenopathy.  Skin: She is not diaphoretic.  Vitals reviewed.       Assessment & Plan:     1. Benign essential HTN We'll increase hydrochlorothiazide to 25 mg from 12.5 mg. She is to call the office if symptoms continue to persist with the increase or if she has any adverse reactions. If not I will see her back in 4 weeks to recheck her blood pressure at that time. - hydrochlorothiazide (HYDRODIURIL) 25 MG tablet; Take 1 tablet (25 mg total) by mouth daily.  Dispense: 30 tablet; Refill: 1  2. Vitamin B 12 deficiency B12 injection given today without complications. -  cyanocobalamin ((VITAMIN B-12)) injection 1,000 mcg; Inject 1 mL (1,000 mcg total) into the muscle once.  3. Gastroesophageal reflux disease, esophagitis presence not specified Currently stable on current dose of Nexium 40 mg. Continue current medical treatment plan. Medication was pulled for refill. - esomeprazole (NEXIUM) 40 MG capsule; Take 1 capsule (40 mg total) by mouth daily.  Dispense: 30 capsule; Refill: 11  4. Arthralgia of temporomandibular joint, unspecified laterality Currently stable on meloxicam 7.5 mg 1 tablet twice daily. Continue current medical treatment plan. Education was pulled for refill as below. - meloxicam (MOBIC) 7.5 MG tablet; Take 1 tablet (7.5 mg total) by mouth 2 (two) times daily.  Dispense: 60 tablet; Refill: 12  5. Depression Has been stable on Zoloft 150 mg. She would like to try to decrease the dose and possibly discontinue Zoloft. I will slowly start tapering her dose of Zoloft down. I will decrease her to 100 mg daily for the next 4 weeks. I will reevaluate her to see how she is doing with that dose. If she is doing well steadily titrate the dose down until discontinued. She is to call the office if she has any worsening symptoms in the meantime. - sertraline (ZOLOFT) 100 MG tablet; Take 1 tablet (100 mg total) by mouth daily.  Dispense: 30 tablet; Refill: 1       Margaretann Loveless, PA-C  [facility]

## 2015-08-16 LAB — CBC WITH DIFFERENTIAL/PLATELET
BASOS: 1 %
Basophils Absolute: 0 10*3/uL (ref 0.0–0.2)
EOS (ABSOLUTE): 0.1 10*3/uL (ref 0.0–0.4)
Eos: 1 %
Hematocrit: 43.3 % (ref 34.0–46.6)
Hemoglobin: 14.3 g/dL (ref 11.1–15.9)
Immature Grans (Abs): 0 10*3/uL (ref 0.0–0.1)
Immature Granulocytes: 0 %
Lymphocytes Absolute: 2.6 10*3/uL (ref 0.7–3.1)
Lymphs: 40 %
MCH: 31.1 pg (ref 26.6–33.0)
MCHC: 33 g/dL (ref 31.5–35.7)
MCV: 94 fL (ref 79–97)
MONOS ABS: 0.4 10*3/uL (ref 0.1–0.9)
Monocytes: 6 %
NEUTROS ABS: 3.4 10*3/uL (ref 1.4–7.0)
Neutrophils: 52 %
PLATELETS: 234 10*3/uL (ref 150–379)
RBC: 4.6 x10E6/uL (ref 3.77–5.28)
RDW: 13.7 % (ref 12.3–15.4)
WBC: 6.5 10*3/uL (ref 3.4–10.8)

## 2015-08-16 LAB — COMPREHENSIVE METABOLIC PANEL
A/G RATIO: 2 (ref 1.1–2.5)
ALT: 20 IU/L (ref 0–32)
AST: 20 IU/L (ref 0–40)
Albumin: 4.7 g/dL (ref 3.5–5.5)
Alkaline Phosphatase: 65 IU/L (ref 39–117)
BILIRUBIN TOTAL: 0.4 mg/dL (ref 0.0–1.2)
BUN/Creatinine Ratio: 13 (ref 9–23)
BUN: 12 mg/dL (ref 6–24)
CHLORIDE: 100 mmol/L (ref 96–106)
CO2: 23 mmol/L (ref 18–29)
Calcium: 9.4 mg/dL (ref 8.7–10.2)
Creatinine, Ser: 0.9 mg/dL (ref 0.57–1.00)
GFR calc Af Amer: 84 mL/min/{1.73_m2} (ref >59)
GFR calc non Af Amer: 73 mL/min/{1.73_m2} (ref >59)
Globulin, Total: 2.3 g/dL (ref 1.5–4.5)
Glucose: 118 mg/dL — ABNORMAL HIGH (ref 65–99)
POTASSIUM: 4.3 mmol/L (ref 3.5–5.2)
Sodium: 142 mmol/L (ref 134–144)
Total Protein: 7 g/dL (ref 6.0–8.5)

## 2015-08-16 LAB — TSH: TSH: 2.01 u[IU]/mL (ref 0.450–4.500)

## 2015-08-19 ENCOUNTER — Telehealth: Payer: Self-pay

## 2015-08-19 NOTE — Telephone Encounter (Signed)
-----   Message from Margaretann LovelessJennifer M Burnette, PA-C sent at 08/19/2015  9:14 AM EST ----- All labs are within normal limits and stable.  Thanks! -JB

## 2015-08-19 NOTE — Telephone Encounter (Signed)
LMTCB  Thanks,  -Joseline 

## 2015-08-21 NOTE — Telephone Encounter (Signed)
Patient advised as directed below.  Thanks,  -Maryagnes Carrasco 

## 2015-09-17 ENCOUNTER — Encounter: Payer: Self-pay | Admitting: Physician Assistant

## 2015-09-17 ENCOUNTER — Ambulatory Visit (INDEPENDENT_AMBULATORY_CARE_PROVIDER_SITE_OTHER): Payer: 59 | Admitting: Physician Assistant

## 2015-09-17 VITALS — BP 130/80 | HR 79 | Temp 97.8°F | Resp 16 | Wt 179.0 lb

## 2015-09-17 DIAGNOSIS — F329 Major depressive disorder, single episode, unspecified: Secondary | ICD-10-CM

## 2015-09-17 DIAGNOSIS — I10 Essential (primary) hypertension: Secondary | ICD-10-CM | POA: Diagnosis not present

## 2015-09-17 DIAGNOSIS — R600 Localized edema: Secondary | ICD-10-CM

## 2015-09-17 DIAGNOSIS — F32A Depression, unspecified: Secondary | ICD-10-CM

## 2015-09-17 DIAGNOSIS — E538 Deficiency of other specified B group vitamins: Secondary | ICD-10-CM | POA: Diagnosis not present

## 2015-09-17 MED ORDER — FUROSEMIDE 20 MG PO TABS: 20.0000 mg | ORAL_TABLET | Freq: Every day | ORAL | Status: DC | PRN

## 2015-09-17 MED ORDER — CYANOCOBALAMIN 1000 MCG/ML IJ SOLN
1000.0000 ug | Freq: Once | INTRAMUSCULAR | Status: AC
  Administered 2015-09-17: 1000 ug via INTRAMUSCULAR

## 2015-09-17 MED ORDER — SERTRALINE HCL 100 MG PO TABS: 50.0000 mg | ORAL_TABLET | Freq: Every day | ORAL | Status: DC

## 2015-09-17 NOTE — Patient Instructions (Signed)
Cut sertraline in half to take  daily.  May take furosemide as needed for lower extremity edema

## 2015-09-17 NOTE — Progress Notes (Signed)
Patient: Lisa Alvarado Female    DOB: 08/27/1960   55 y.o.   MRN: [id number] Visit Date: 09/17/2015  Today's Provider: Margaretann Loveless, PA-C   Chief Complaint  Patient presents with  . Follow-up    HTN and Depression also for B12 injection   Subjective:    HPI Hypertension: Patient here for follow-up of elevated blood pressure. She is exercising-walking and going up the stairs and is adherent to low salt diet.  Blood pressure is well controlled at home it ranges in between 156/92- 124/70. Cardiac symptoms fatigue and lower extremity edema. Patient denies chest pain.  Cardiovascular risk factors: none.  Depression: Patient here for follow-up on depression. She complains of difficulty concentrating, fatigue and insomnia she states this is all related to work and feels is more stress than depressed mood. Patient was seen 4 weeks ago and her Zoloft was decreased to 100 mg. she does however state that she is feeling well and would like to try to decrease to 50 mg a possible.    Allergies  Allergen Reactions  . Sulfa Antibiotics   . Sulfur Other (See Comments)  . Sulfamethoxazole-Trimethoprim Rash   Previous Medications   ALPRAZOLAM (XANAX) 0.5 MG TABLET    TAKE 1 TABLET BY MOUTH EVERY 8 HOURS AS NEEDED OR AT BEDTIME   CYANOCOBALAMIN (,VITAMIN B-12,) 1000 MCG/ML INJECTION    Inject into the muscle.   ESOMEPRAZOLE (NEXIUM) 40 MG CAPSULE    Take 1 capsule (40 mg total) by mouth daily.   HYDROCHLOROTHIAZIDE (HYDRODIURIL) 25 MG TABLET    Take 1 tablet (25 mg total) by mouth daily.   LEVONORGESTREL (MIRENA) 20 MCG/24HR IUD    by Intrauterine route.   LORATADINE (CLARITIN) 10 MG TABLET    1 tablet daily.   MELOXICAM (MOBIC) 7.5 MG TABLET    Take 1 tablet (7.5 mg total) by mouth 2 (two) times daily.   MONTELUKAST (SINGULAIR) 10 MG TABLET    Take by mouth.   SERTRALINE (ZOLOFT) 100 MG TABLET    Take 1 tablet (100 mg total) by mouth daily.   SIMVASTATIN (ZOCOR) 20 MG TABLET    Take  1 tablet (20 mg total) by mouth daily.   VITAMIN D, ERGOCALCIFEROL, (DRISDOL) 50000 UNITS CAPS CAPSULE    Take 1 capsule (50,000 Units total) by mouth every 7 (seven) days.    Review of Systems  Constitutional: Positive for fatigue (due to work).  Respiratory: Negative.   Cardiovascular: Positive for leg swelling. Negative for chest pain and palpitations.  Gastrointestinal: Negative.   Neurological: Positive for headaches (sometimes). Negative for dizziness.  Psychiatric/Behavioral: Positive for agitation (due to wrok). The patient is nervous/anxious (sometimes).     Social History  Substance Use Topics  . Smoking status: Current Every Day Smoker    Types: E-cigarettes  . Smokeless tobacco: Never Used  . Alcohol Use: No   Objective:   BP 130/80 mmHg  Pulse 79  Temp(Src) 97.8 F (36.6 C) (Oral)  Resp 16  Wt 179 lb (81.194 kg)  Physical Exam  Constitutional: She appears well-developed and well-nourished. No distress.  Neck: Normal range of motion. Neck supple. No JVD present. No tracheal deviation present. No thyromegaly present.  Cardiovascular: Normal rate, regular rhythm and normal heart sounds.  Exam reveals no gallop and no friction rub.   No murmur heard. Pulmonary/Chest: Effort normal and breath sounds normal. No respiratory distress. She has no wheezes. She has no rales.  Lymphadenopathy:  She has no cervical adenopathy.  Skin: She is not diaphoretic.  Vitals reviewed.       Assessment & Plan:     1. Benign essential HTN Blood pressure is stable with hydrochlorothiazide 25 mg. We'll continue current medical treatment plan.  2. B12 deficiency B12 injection was given today without complication. - cyanocobalamin ((VITAMIN B-12)) injection 1,000 mcg; Inject 1 mL (1,000 mcg total) into the muscle once.  3. Depression Stable. She states that she does have increased stress at work but feels that she is coping with this well. She states that she has not increased  her Xanax use. She states that she has only taken 2 Xanax last week. She is still wanting to try to discontinue her Zoloft. We will decrease Zoloft from 100 mg to 50 mg daily. I will see her back in 4 weeks for a recheck and also for her to get her B12 injection. - sertraline (ZOLOFT) 100 MG tablet; Take 0.5 tablets (50 mg total) by mouth daily.  Dispense: 30 tablet; Refill: 1  4. Bilateral edema of lower extremity She may begin taking the furosemide again as needed for lower extremity edema. She had discontinued use secondary to starting adjuvant for thiazide. She states that she notices her legs swell mostly after working a 12 hour shift where she is standing. She states that when she uses the furosemide it did help with her lower extremity edema and thus we will restart this medication as below. - furosemide (LASIX) 20 MG tablet; Take 1 tablet (20 mg total) by mouth daily as needed.  Dispense: 30 tablet; Refill: 3       Margaretann Loveless, PA-C  [facility]

## 2015-10-11 ENCOUNTER — Other Ambulatory Visit: Payer: Self-pay | Admitting: Physician Assistant

## 2015-10-11 DIAGNOSIS — F32A Depression, unspecified: Secondary | ICD-10-CM

## 2015-10-11 DIAGNOSIS — F329 Major depressive disorder, single episode, unspecified: Secondary | ICD-10-CM

## 2015-10-11 DIAGNOSIS — I10 Essential (primary) hypertension: Secondary | ICD-10-CM

## 2015-10-16 ENCOUNTER — Ambulatory Visit (INDEPENDENT_AMBULATORY_CARE_PROVIDER_SITE_OTHER): Payer: 59

## 2015-10-16 DIAGNOSIS — E538 Deficiency of other specified B group vitamins: Secondary | ICD-10-CM

## 2015-10-16 MED ORDER — CYANOCOBALAMIN 1000 MCG/ML IJ SOLN
1000.0000 ug | Freq: Once | INTRAMUSCULAR | Status: AC
  Administered 2015-10-16: 1000 ug via INTRAMUSCULAR

## 2015-10-16 NOTE — Progress Notes (Signed)
Patient came into office this morning for B12 injection, injection was last administered on 09/17/15. Patient was administered today in her right deltoid with a 25gauge 1inch needle. Patient is to return back to clinic in one months for next injection.

## 2015-11-17 ENCOUNTER — Encounter: Payer: Self-pay | Admitting: Family Medicine

## 2015-11-17 ENCOUNTER — Ambulatory Visit (INDEPENDENT_AMBULATORY_CARE_PROVIDER_SITE_OTHER): Payer: 59 | Admitting: Family Medicine

## 2015-11-17 ENCOUNTER — Other Ambulatory Visit: Payer: Self-pay | Admitting: Family Medicine

## 2015-11-17 VITALS — BP 112/78 | HR 60 | Temp 98.6°F | Resp 16 | Wt 175.8 lb

## 2015-11-17 DIAGNOSIS — B349 Viral infection, unspecified: Secondary | ICD-10-CM

## 2015-11-17 DIAGNOSIS — E538 Deficiency of other specified B group vitamins: Secondary | ICD-10-CM

## 2015-11-17 DIAGNOSIS — Z8669 Personal history of other diseases of the nervous system and sense organs: Secondary | ICD-10-CM | POA: Diagnosis not present

## 2015-11-17 MED ORDER — SUMATRIPTAN-NAPROXEN SODIUM 85-500 MG PO TABS: 1.0000 | ORAL_TABLET | ORAL | Status: DC | PRN

## 2015-11-17 MED ORDER — CYANOCOBALAMIN 1000 MCG/ML IJ SOLN
1000.0000 ug | Freq: Once | INTRAMUSCULAR | Status: AC
  Administered 2015-11-17: 1000 ug via INTRAMUSCULAR

## 2015-11-17 NOTE — Progress Notes (Signed)
Patient ID: Lisa Alvarado, female   DOB: 01/12/1961, 55 y.o.   MRN: [id number]

## 2015-11-17 NOTE — Progress Notes (Signed)
Subjective:     Patient ID: Lisa Alvarado, female   DOB: 11-Dec-1960, 55 y.o.   MRN: [id number]  HPI  Chief Complaint  Patient presents with  . Injections    Patient presents in office today for B12 injection, last injection given in office on 09/17/15.  . Sinusitis    Patient has complaints today of sinus pain and pressure since 11/14/15, patient reports that when she woke up that day she had a severe headache, ringing in both ears and eye irritation. Patient reports that she has sinus pressure below the eyes, patient has taken otc Sinex and nasal spray.   Reports onset of migraine like headache 3/31 followed by fever > 100, scratchy throat, sinus congestion and occasional cough. Wishes refill on Treximet for her rare migraine headaches.   Review of Systems     Objective:   Physical Exam  Constitutional: She appears well-developed and well-nourished. No distress.  Ears: T.M's intact without inflammation Sinuses: moderate paranasal sinus tenderness Throat: no tonsillar enlargement or exudate Neck: no cervical adenopathy Lungs: clear     Assessment:    1. B12 deficiency - cyanocobalamin ((VITAMIN B-12)) injection 1,000 mcg; Inject 1 mL (1,000 mcg total) into the muscle once.  2. Viral syndrome  3. History of migraine headaches - SUMAtriptan-naproxen (TREXIMET) 85-500 MG tablet; Take 1 tablet by mouth every 2 (two) hours as needed for migraine. Not to exceed two tablets in a 24 hour period.  Dispense: 9 tablet; Refill: 0    Plan:    Discussed use of otc medication. Work excuse for 3/31-11/19/15.

## 2015-11-17 NOTE — Patient Instructions (Signed)
Discussed use of Mucinex D for congestion, Delsym for cough, and Benadryl for postnasal drainage. Let me know if sinuses not better by the end of the week.

## 2015-11-18 ENCOUNTER — Other Ambulatory Visit: Payer: Self-pay | Admitting: Family Medicine

## 2015-11-18 ENCOUNTER — Telehealth: Payer: Self-pay | Admitting: Family Medicine

## 2015-11-18 NOTE — Telephone Encounter (Signed)
Pt saw Nadine CountsBob for an OV 11/17/15. Pt would like to get her work note extended to where she can return to work on Monday 11/24/15 b/c she doesn't think she can go back to work this week. Please advise. Thanks TNP

## 2015-11-18 NOTE — Telephone Encounter (Signed)
Patient has been advised letter is available for pick up at the front desk. KW

## 2015-11-18 NOTE — Telephone Encounter (Signed)
Work excuse rewritten for 3/31-4/10.  Available for pickup up front.

## 2015-11-18 NOTE — Telephone Encounter (Signed)
Please review. KW 

## 2015-12-16 ENCOUNTER — Encounter: Payer: Self-pay | Admitting: Physician Assistant

## 2015-12-16 ENCOUNTER — Ambulatory Visit (INDEPENDENT_AMBULATORY_CARE_PROVIDER_SITE_OTHER): Payer: 59 | Admitting: Physician Assistant

## 2015-12-16 VITALS — BP 140/80 | HR 89 | Temp 98.1°F | Resp 16 | Ht 66.0 in | Wt 177.2 lb

## 2015-12-16 DIAGNOSIS — E538 Deficiency of other specified B group vitamins: Secondary | ICD-10-CM

## 2015-12-16 DIAGNOSIS — Z0189 Encounter for other specified special examinations: Secondary | ICD-10-CM

## 2015-12-16 DIAGNOSIS — Z008 Encounter for other general examination: Secondary | ICD-10-CM

## 2015-12-16 MED ORDER — CYANOCOBALAMIN 1000 MCG/ML IJ SOLN
1000.0000 ug | Freq: Once | INTRAMUSCULAR | Status: AC
  Administered 2015-12-16: 1000 ug via INTRAMUSCULAR

## 2015-12-16 NOTE — Progress Notes (Signed)
   Subjective:    Patient ID: Lisa Alvarado, female    DOB: 1960-12-01, 55 y.o.   MRN: [id number]  HPI Lisa Alvarado is a 55 yr old female that comes to the office today for labs for biometric screening for work. She also is here for her B12 injection that she gets monthly. She has no complaints and feels well today.   Review of Systems  Constitutional: Negative.   Respiratory: Negative.   Cardiovascular: Negative.   Gastrointestinal: Negative.   Neurological: Negative.   Psychiatric/Behavioral: Negative.        Objective:   Physical Exam  Constitutional: She appears well-developed and well-nourished. No distress.  Neck: Normal range of motion. Neck supple.  Cardiovascular: Normal rate, regular rhythm and normal heart sounds.  Exam reveals no gallop and no friction rub.   No murmur heard. Pulmonary/Chest: Effort normal and breath sounds normal. No respiratory distress. She has no wheezes. She has no rales.  Skin: She is not diaphoretic.  Vitals reviewed.      Assessment & Plan:  1. Encounter for biometric screening Exam was normal today. Will check labs and f/u pending lab results. She is to bring biometric form by the office and this will be completed once lab results are obtained. She is to call if she has any acute issues in the meantime. If not I will see her back in 3 months for her CPE.  - CBC with Differential - Comprehensive Metabolic Panel (CMET) - Lipid Profile - HgB A1c  2. B12 deficiency B12 injection given today without complication. Patient tolerated well.  - cyanocobalamin ((VITAMIN B-12)) injection 1,000 mcg; Inject 1 mL (1,000 mcg total) into the muscle once. - B12

## 2015-12-16 NOTE — Patient Instructions (Addendum)
B12 given in the Left Deltoid today. Patient is to return in 1 month for the next injection.  Thanks,  -Lisa Alvarado, Female Adopting a healthy lifestyle and getting preventive care can go a long way to promote health and wellness. Talk with your health care provider about what schedule of regular examinations is right for you. This is a good chance for you to check in with your provider about disease prevention and staying healthy. In between checkups, there are plenty of things you can do on your own. Experts have done a lot of research about which lifestyle changes and preventive measures are most likely to keep you healthy. Ask your health care provider for more information. WEIGHT AND DIET  Eat a healthy diet  Be sure to include plenty of vegetables, fruits, low-fat dairy products, and lean protein.  Do not eat a lot of foods high in solid fats, added sugars, or salt.  Get regular exercise. This is one of the most important things you can do for your health.  Most adults should exercise for at least 150 minutes each week. The exercise should increase your heart rate and make you sweat (moderate-intensity exercise).  Most adults should also do strengthening exercises at least twice a week. This is in addition to the moderate-intensity exercise.  Maintain a healthy weight  Body mass index (BMI) is a measurement that can be used to identify possible weight problems. It estimates body fat based on height and weight. Your health care provider can help determine your BMI and help you achieve or maintain a healthy weight.  For females 14 years of age and older:   A BMI below 18.5 is considered underweight.  A BMI of 18.5 to 24.9 is normal.  A BMI of 25 to 29.9 is considered overweight.  A BMI of 30 and above is considered obese.  Watch levels of cholesterol and blood lipids  You should start having your blood tested for lipids and cholesterol at 55 years of age,  then have this test every 5 years.  You may need to have your cholesterol levels checked more often if:  Your lipid or cholesterol levels are high.  You are older than 55 years of age.  You are at high risk for heart disease.  CANCER SCREENING   Lung Cancer  Lung cancer screening is recommended for adults 42-68 years old who are at high risk for lung cancer because of a history of smoking.  A yearly low-dose CT scan of the lungs is recommended for people who:  Currently smoke.  Have quit within the past 15 years.  Have at least a 30-pack-year history of smoking. A pack year is smoking an average of one pack of cigarettes a day for 1 year.  Yearly screening should continue until it has been 15 years since you quit.  Yearly screening should stop if you develop a health problem that would prevent you from having lung cancer treatment.  Breast Cancer  Practice breast self-awareness. This means understanding how your breasts normally appear and feel.  It also means doing regular breast self-exams. Let your health care provider know about any changes, no matter how small.  If you are in your 20s or 30s, you should have a clinical breast exam (CBE) by a health care provider every 1-3 years as part of a regular health exam.  If you are 42 or older, have a CBE every year. Also consider having a breast X-ray (mammogram) every  year.  If you have a family history of breast cancer, talk to your health care provider about genetic screening.  If you are at high risk for breast cancer, talk to your health care provider about having an MRI and a mammogram every year.  Breast cancer gene (BRCA) assessment is recommended for women who have family members with BRCA-related cancers. BRCA-related cancers include:  Breast.  Ovarian.  Tubal.  Peritoneal cancers.  Results of the assessment will determine the need for genetic counseling and BRCA1 and BRCA2 testing. Cervical Cancer Your  health care provider may recommend that you be screened regularly for cancer of the pelvic organs (ovaries, uterus, and vagina). This screening involves a pelvic examination, including checking for microscopic changes to the surface of your cervix (Pap test). You may be encouraged to have this screening done every 3 years, beginning at age 12.  For women ages 16-65, health care providers may recommend pelvic exams and Pap testing every 3 years, or they may recommend the Pap and pelvic exam, combined with testing for human papilloma virus (HPV), every 5 years. Some types of HPV increase your risk of cervical cancer. Testing for HPV may also be done on women of any age with unclear Pap test results.  Other health care providers may not recommend any screening for nonpregnant women who are considered low risk for pelvic cancer and who do not have symptoms. Ask your health care provider if a screening pelvic exam is right for you.  If you have had past treatment for cervical cancer or a condition that could lead to cancer, you need Pap tests and screening for cancer for at least 20 years after your treatment. If Pap tests have been discontinued, your risk factors (such as having a new sexual partner) need to be reassessed to determine if screening should resume. Some women have medical problems that increase the chance of getting cervical cancer. In these cases, your health care provider may recommend more frequent screening and Pap tests. Colorectal Cancer  This type of cancer can be detected and often prevented.  Routine colorectal cancer screening usually begins at 55 years of age and continues through 55 years of age.  Your health care provider may recommend screening at an earlier age if you have risk factors for colon cancer.  Your health care provider may also recommend using home test kits to check for hidden blood in the stool.  A small camera at the end of a tube can be used to examine your  colon directly (sigmoidoscopy or colonoscopy). This is done to check for the earliest forms of colorectal cancer.  Routine screening usually begins at age 29.  Direct examination of the colon should be repeated every 5-10 years through 55 years of age. However, you may need to be screened more often if early forms of precancerous polyps or small growths are found. Skin Cancer  Check your skin from head to toe regularly.  Tell your health care provider about any new moles or changes in moles, especially if there is a change in a mole's shape or color.  Also tell your health care provider if you have a mole that is larger than the size of a pencil eraser.  Always use sunscreen. Apply sunscreen liberally and repeatedly throughout the day.  Protect yourself by wearing long sleeves, pants, a wide-brimmed hat, and sunglasses whenever you are outside. HEART DISEASE, DIABETES, AND HIGH BLOOD PRESSURE   High blood pressure causes heart disease and increases  the risk of stroke. High blood pressure is more likely to develop in:  People who have blood pressure in the high end of the normal range (130-139/85-89 mm Hg).  People who are overweight or obese.  People who are African American.  If you are 30-35 years of age, have your blood pressure checked every 3-5 years. If you are 12 years of age or older, have your blood pressure checked every year. You should have your blood pressure measured twice--once when you are at a hospital or clinic, and once when you are not at a hospital or clinic. Record the average of the two measurements. To check your blood pressure when you are not at a hospital or clinic, you can use:  An automated blood pressure machine at a pharmacy.  A home blood pressure monitor.  If you are between 68 years and 69 years old, ask your health care provider if you should take aspirin to prevent strokes.  Have regular diabetes screenings. This involves taking a blood sample to  check your fasting blood sugar level.  If you are at a normal weight and have a low risk for diabetes, have this test once every three years after 55 years of age.  If you are overweight and have a high risk for diabetes, consider being tested at a younger age or more often. PREVENTING INFECTION  Hepatitis B  If you have a higher risk for hepatitis B, you should be screened for this virus. You are considered at high risk for hepatitis B if:  You were born in a country where hepatitis B is common. Ask your health care provider which countries are considered high risk.  Your parents were born in a high-risk country, and you have not been immunized against hepatitis B (hepatitis B vaccine).  You have HIV or AIDS.  You use needles to inject street drugs.  You live with someone who has hepatitis B.  You have had sex with someone who has hepatitis B.  You get hemodialysis treatment.  You take certain medicines for conditions, including cancer, organ transplantation, and autoimmune conditions. Hepatitis C  Blood testing is recommended for:  Everyone born from [birth year] through 1965.  Anyone with known risk factors for hepatitis C. Sexually transmitted infections (STIs)  You should be screened for sexually transmitted infections (STIs) including gonorrhea and chlamydia if:  You are sexually active and are younger than 55 years of age.  You are older than 55 years of age and your health care provider tells you that you are at risk for this type of infection.  Your sexual activity has changed since you were last screened and you are at an increased risk for chlamydia or gonorrhea. Ask your health care provider if you are at risk.  If you do not have HIV, but are at risk, it may be recommended that you take a prescription medicine daily to prevent HIV infection. This is called pre-exposure prophylaxis (PrEP). You are considered at risk if:  You are sexually active and do not regularly use  condoms or know the HIV status of your partner(s).  You take drugs by injection.  You are sexually active with a partner who has HIV. Talk with your health care provider about whether you are at high risk of being infected with HIV. If you choose to begin PrEP, you should first be tested for HIV. You should then be tested every 3 months for as long as you are taking PrEP.  PREGNANCY  If you are premenopausal and you may become pregnant, ask your health care provider about preconception counseling.  If you may become pregnant, take 400 to 800 micrograms (mcg) of folic acid every day.  If you want to prevent pregnancy, talk to your health care provider about birth control (contraception). OSTEOPOROSIS AND MENOPAUSE   Osteoporosis is a disease in which the bones lose minerals and strength with aging. This can result in serious bone fractures. Your risk for osteoporosis can be identified using a bone density scan.  If you are 65 years of age or older, or if you are at risk for osteoporosis and fractures, ask your health care provider if you should be screened.  Ask your health care provider whether you should take a calcium or vitamin D supplement to lower your risk for osteoporosis.  Menopause may have certain physical symptoms and risks.  Hormone replacement therapy may reduce some of these symptoms and risks. Talk to your health care provider about whether hormone replacement therapy is right for you.  HOME CARE INSTRUCTIONS   Schedule regular health, dental, and eye exams.  Stay current with your immunizations.   Do not use any tobacco products including cigarettes, chewing tobacco, or electronic cigarettes.  If you are pregnant, do not drink alcohol.  If you are breastfeeding, limit how much and how often you drink alcohol.  Limit alcohol intake to no more than 1 drink per day for nonpregnant women. One drink equals 12 ounces of beer, 5 ounces of wine, or 1 ounces of hard  liquor.  Do not use street drugs.  Do not share needles.  Ask your health care provider for help if you need support or information about quitting drugs.  Tell your health care provider if you often feel depressed.  Tell your health care provider if you have ever been abused or do not feel safe at home.   This information is not intended to replace advice given to you by your health care provider. Make sure you discuss any questions you have with your health care provider.   Document Released: 02/15/2011 Document Revised: 08/23/2014 Document Reviewed: 07/04/2013 Elsevier Interactive Patient Education 2016 [employer]  

## 2015-12-17 ENCOUNTER — Telehealth: Payer: Self-pay

## 2015-12-17 LAB — COMPREHENSIVE METABOLIC PANEL
ALT: 17 IU/L (ref 0–32)
AST: 16 IU/L (ref 0–40)
Albumin/Globulin Ratio: 1.7 (ref 1.2–2.2)
Albumin: 4.8 g/dL (ref 3.5–5.5)
Alkaline Phosphatase: 83 IU/L (ref 39–117)
BUN/Creatinine Ratio: 16 (ref 9–23)
BUN: 15 mg/dL (ref 6–24)
Bilirubin Total: 0.4 mg/dL (ref 0.0–1.2)
CO2: 25 mmol/L (ref 18–29)
CREATININE: 0.96 mg/dL (ref 0.57–1.00)
Calcium: 9.5 mg/dL (ref 8.7–10.2)
Chloride: 98 mmol/L (ref 96–106)
GFR calc non Af Amer: 67 mL/min/{1.73_m2} (ref >59)
GFR, EST AFRICAN AMERICAN: 78 mL/min/{1.73_m2} (ref >59)
Globulin, Total: 2.9 g/dL (ref 1.5–4.5)
Glucose: 110 mg/dL — ABNORMAL HIGH (ref 65–99)
Potassium: 4.7 mmol/L (ref 3.5–5.2)
Sodium: 143 mmol/L (ref 134–144)
TOTAL PROTEIN: 7.7 g/dL (ref 6.0–8.5)

## 2015-12-17 LAB — VITAMIN B12: Vitamin B-12: >2000 pg/mL — ABNORMAL HIGH (ref 211–946)

## 2015-12-17 LAB — LIPID PANEL
CHOL/HDL RATIO: 3.7 ratio (ref 0.0–4.4)
Cholesterol, Total: 185 mg/dL (ref 100–199)
HDL: 50 mg/dL (ref >39)
LDL Calculated: 114 mg/dL — ABNORMAL HIGH (ref 0–99)
TRIGLYCERIDES: 103 mg/dL (ref 0–149)
VLDL CHOLESTEROL CAL: 21 mg/dL (ref 5–40)

## 2015-12-17 LAB — CBC WITH DIFFERENTIAL/PLATELET
BASOS: 1 %
Basophils Absolute: 0 10*3/uL (ref 0.0–0.2)
EOS (ABSOLUTE): 0.1 10*3/uL (ref 0.0–0.4)
Eos: 1 %
Hematocrit: 43.9 % (ref 34.0–46.6)
Hemoglobin: 14.8 g/dL (ref 11.1–15.9)
IMMATURE GRANS (ABS): 0 10*3/uL (ref 0.0–0.1)
IMMATURE GRANULOCYTES: 0 %
LYMPHS: 38 %
Lymphocytes Absolute: 2.3 10*3/uL (ref 0.7–3.1)
MCH: 30.8 pg (ref 26.6–33.0)
MCHC: 33.7 g/dL (ref 31.5–35.7)
MCV: 92 fL (ref 79–97)
Monocytes Absolute: 0.3 10*3/uL (ref 0.1–0.9)
Monocytes: 6 %
NEUTROS PCT: 54 %
Neutrophils Absolute: 3.4 10*3/uL (ref 1.4–7.0)
PLATELETS: 250 10*3/uL (ref 150–379)
RBC: 4.8 x10E6/uL (ref 3.77–5.28)
RDW: 13.9 % (ref 12.3–15.4)
WBC: 6.1 10*3/uL (ref 3.4–10.8)

## 2015-12-17 LAB — HEMOGLOBIN A1C
Est. average glucose Bld gHb Est-mCnc: 120 mg/dL
HEMOGLOBIN A1C: 5.8 % — AB (ref 4.8–5.6)

## 2015-12-17 NOTE — Telephone Encounter (Signed)
-----   Message from Margaretann LovelessJennifer M Burnette, PA-C sent at 12/17/2015  8:39 AM EDT ----- All labs are within normal limits and stable.  Cholesterol has improved. Keep up the good work. HgBA1c has slightly increased from 5.6 to 5.8. Limit simple carbohydrates and sugars in diet. Biometric form completed and faxed to number on form.Thanks! -JB

## 2015-12-17 NOTE — Telephone Encounter (Signed)
Patient advised as directed below.  Thanks,  -Kambre Messner 

## 2016-01-02 ENCOUNTER — Emergency Department
Admission: EM | Admit: 2016-01-02 | Discharge: 2016-01-03 | Disposition: A | Discharge status: Home / Self Care | Payer: 59 | Attending: Emergency Medicine | Admitting: Emergency Medicine

## 2016-01-02 ENCOUNTER — Encounter: Payer: Self-pay | Admitting: *Deleted

## 2016-01-02 ENCOUNTER — Emergency Department: Payer: 59

## 2016-01-02 DIAGNOSIS — Y929 Unspecified place or not applicable: Secondary | ICD-10-CM | POA: Insufficient documentation

## 2016-01-02 DIAGNOSIS — Y999 Unspecified external cause status: Secondary | ICD-10-CM | POA: Diagnosis not present

## 2016-01-02 DIAGNOSIS — Y939 Activity, unspecified: Secondary | ICD-10-CM | POA: Insufficient documentation

## 2016-01-02 DIAGNOSIS — I10 Essential (primary) hypertension: Secondary | ICD-10-CM | POA: Diagnosis not present

## 2016-01-02 DIAGNOSIS — W1839XA Other fall on same level, initial encounter: Secondary | ICD-10-CM | POA: Insufficient documentation

## 2016-01-02 DIAGNOSIS — S93402A Sprain of unspecified ligament of left ankle, initial encounter: Secondary | ICD-10-CM | POA: Insufficient documentation

## 2016-01-02 DIAGNOSIS — F17290 Nicotine dependence, other tobacco product, uncomplicated: Secondary | ICD-10-CM | POA: Insufficient documentation

## 2016-01-02 DIAGNOSIS — Z79899 Other long term (current) drug therapy: Secondary | ICD-10-CM | POA: Diagnosis not present

## 2016-01-02 DIAGNOSIS — S99912A Unspecified injury of left ankle, initial encounter: Secondary | ICD-10-CM | POA: Diagnosis present

## 2016-01-02 MED ORDER — OXYCODONE-ACETAMINOPHEN 5-325 MG PO TABS
1.0000 | ORAL_TABLET | Freq: Once | ORAL | Status: AC
Start: 2016-01-03 — End: 2016-01-03
  Administered 2016-01-03: 1 via ORAL
  Filled 2016-01-02: qty 1

## 2016-01-02 MED ORDER — OXYCODONE-ACETAMINOPHEN 5-325 MG PO TABS: 1.0000 | ORAL_TABLET | Freq: Four times a day (QID) | ORAL | Status: DC | PRN

## 2016-01-02 NOTE — ED Notes (Signed)
Pt presents w/ c/o ankle pain after twisting L ankle and falling. Pt has abrasion to L ankle and L upper arm. Pt denies further injury. Pt has injured L ankle in the past and has instrumentation in L ankle. Pt is alert and oriented x 4. Pt denies taking OTC meds for ankle pain and has not iced ankle.
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
## 2016-01-02 NOTE — Discharge Instructions (Signed)
Acute Ankle Sprain With Phase I Rehab An acute ankle sprain is a partial or complete tear in one or more of the ligaments of the ankle due to traumatic injury. The severity of the injury depends on both the number of ligaments sprained and the grade of sprain. There are 3 grades of sprains.   A grade 1 sprain is a mild sprain. There is a slight pull without obvious tearing. There is no loss of strength, and the muscle and ligament are the correct length.  A grade 2 sprain is a moderate sprain. There is tearing of fibers within the substance of the ligament where it connects two bones or two cartilages. The length of the ligament is increased, and there is usually decreased strength.  A grade 3 sprain is a complete rupture of the ligament and is uncommon. In addition to the grade of sprain, there are three types of ankle sprains.  Lateral ankle sprains: This is a sprain of one or more of the three ligaments on the outer side (lateral) of the ankle. These are the most common sprains. Medial ankle sprains: There is one large triangular ligament of the inner side (medial) of the ankle that is susceptible to injury. Medial ankle sprains are less common. Syndesmosis, "high ankle," sprains: The syndesmosis is the ligament that connects the two bones of the lower leg. Syndesmosis sprains usually only occur with very severe ankle sprains. SYMPTOMS  Pain, tenderness, and swelling in the ankle, starting at the side of injury that may progress to the whole ankle and foot with time.  "Pop" or tearing sensation at the time of injury.  Bruising that may spread to the heel.  Impaired ability to walk soon after injury. CAUSES   Acute ankle sprains are caused by trauma placed on the ankle that temporarily forces or pries the anklebone (talus) out of its normal socket.  Stretching or tearing of the ligaments that normally hold the joint in place (usually due to a twisting injury). RISK INCREASES  WITH:  Previous ankle sprain.  Sports in which the foot may land awkwardly (i.e., basketball, volleyball, or soccer) or walking or running on uneven or rough surfaces.  Shoes with inadequate support to prevent sideways motion when stress occurs.  Poor strength and flexibility.  Poor balance skills.  Contact sports. PREVENTION   Warm up and stretch properly before activity.  Maintain physical fitness:  Ankle and leg flexibility, muscle strength, and endurance.  Cardiovascular fitness.  Balance training activities.  Use proper technique and have a coach correct improper technique.  Taping, protective strapping, bracing, or high-top tennis shoes may help prevent injury. Initially, tape is best; however, it loses most of its support function within 10 to 15 minutes.  Wear proper-fitted protective shoes (High-top shoes with taping or bracing is more effective than either alone).  Provide the ankle with support during sports and practice activities for 12 months following injury. PROGNOSIS   If treated properly, ankle sprains can be expected to recover completely; however, the length of recovery depends on the degree of injury.  A grade 1 sprain usually heals enough in 5 to 7 days to allow modified activity and requires an average of 6 weeks to heal completely.  A grade 2 sprain requires 6 to 10 weeks to heal completely.  A grade 3 sprain requires 12 to 16 weeks to heal.  A syndesmosis sprain often takes more than 3 months to heal. RELATED COMPLICATIONS   Frequent recurrence of symptoms may   result in a chronic problem. Appropriately addressing the problem the first time decreases the frequency of recurrence and optimizes healing time. Severity of the initial sprain does not predict the likelihood of later instability. °· Injury to other structures (bone, cartilage, or tendon). °· A chronically unstable or arthritic ankle joint is a possibility with repeated  sprains. °TREATMENT °Treatment initially involves the use of ice, medication, and compression bandages to help reduce pain and inflammation. Ankle sprains are usually immobilized in a walking cast or boot to allow for healing. Crutches may be recommended to reduce pressure on the injury. After immobilization, strengthening and stretching exercises may be necessary to regain strength and a full range of motion. Surgery is rarely needed to treat ankle sprains. °MEDICATION  °· Nonsteroidal anti-inflammatory medications, such as aspirin and ibuprofen (do not take for the first 3 days after injury or within 7 days before surgery), or other minor pain relievers, such as acetaminophen, are often recommended. Take these as directed by your caregiver. Contact your caregiver immediately if any bleeding, stomach upset, or signs of an allergic reaction occur from these medications. °· Ointments applied to the skin may be helpful. °· Pain relievers may be prescribed as necessary by your caregiver. Do not take prescription pain medication for longer than 4 to 7 days. Use only as directed and only as much as you need. °HEAT AND COLD °· Cold treatment (icing) is used to relieve pain and reduce inflammation for acute and chronic cases. Cold should be applied for 10 to 15 minutes every 2 to 3 hours for inflammation and pain and immediately after any activity that aggravates your symptoms. Use ice packs or an ice massage. °· Heat treatment may be used before performing stretching and strengthening activities prescribed by your caregiver. Use a heat pack or a warm soak. °SEEK IMMEDIATE MEDICAL CARE IF:  °· Pain, swelling, or bruising worsens despite treatment. °· You experience pain, numbness, discoloration, or coldness in the foot or toes. °· New, unexplained symptoms develop (drugs used in treatment may produce side effects.) °EXERCISES  °PHASE I EXERCISES °RANGE OF MOTION (ROM) AND STRETCHING EXERCISES - Ankle Sprain, Acute Phase I,  Weeks 1 to 2 °These exercises may help you when beginning to restore flexibility in your ankle. You will likely work on these exercises for the 1 to 2 weeks after your injury. Once your physician, physical therapist, or athletic trainer sees adequate progress, he or she will advance your exercises. While completing these exercises, remember:  °· Restoring tissue flexibility helps normal motion to return to the joints. This allows healthier, less painful movement and activity. °· An effective stretch should be held for at least 30 seconds. °· A stretch should never be painful. You should only feel a gentle lengthening or release in the stretched tissue. °RANGE OF MOTION - Dorsi/Plantar Flexion °· While sitting with your right / left knee straight, draw the top of your foot upwards by flexing your ankle. Then reverse the motion, pointing your toes downward. °· Hold each position for __________ seconds. °· After completing your first set of exercises, repeat this exercise with your knee bent. °Repeat __________ times. Complete this exercise __________ times per day.  °RANGE OF MOTION - Ankle Alphabet °· Imagine your right / left big toe is a pen. °· Keeping your hip and knee still, write out the entire alphabet with your "pen." Make the letters as large as you can without increasing any discomfort. °Repeat __________ times. Complete this exercise __________   times per day.  °STRENGTHENING EXERCISES - Ankle Sprain, Acute -Phase I, Weeks 1 to 2 °These exercises may help you when beginning to restore strength in your ankle. You will likely work on these exercises for 1 to 2 weeks after your injury. Once your physician, physical therapist, or athletic trainer sees adequate progress, he or she will advance your exercises. While completing these exercises, remember:  °· Muscles can gain both the endurance and the strength needed for everyday activities through controlled exercises. °· Complete these exercises as instructed by  your physician, physical therapist, or athletic trainer. Progress the resistance and repetitions only as guided. °· You may experience muscle soreness or fatigue, but the pain or discomfort you are trying to eliminate should never worsen during these exercises. If this pain does worsen, stop and make certain you are following the directions exactly. If the pain is still present after adjustments, discontinue the exercise until you can discuss the trouble with your clinician. °STRENGTH - Dorsiflexors °· Secure a rubber exercise band/tubing to a fixed object (i.e., table, pole) and loop the other end around your right / left foot. °· Sit on the floor facing the fixed object. The band/tubing should be slightly tense when your foot is relaxed. °· Slowly draw your foot back toward you using your ankle and toes. °· Hold this position for __________ seconds. Slowly release the tension in the band and return your foot to the starting position. °Repeat __________ times. Complete this exercise __________ times per day.  °STRENGTH - Plantar-flexors  °· Sit with your right / left leg extended. Holding onto both ends of a rubber exercise band/tubing, loop it around the ball of your foot. Keep a slight tension in the band. °· Slowly push your toes away from you, pointing them downward. °· Hold this position for __________ seconds. Return slowly, controlling the tension in the band/tubing. °Repeat __________ times. Complete this exercise __________ times per day.  °STRENGTH - Ankle Eversion °· Secure one end of a rubber exercise band/tubing to a fixed object (table, pole). Loop the other end around your foot just before your toes. °· Place your fists between your knees. This will focus your strengthening at your ankle. °· Drawing the band/tubing across your opposite foot, slowly, pull your little toe out and up. Make sure the band/tubing is positioned to resist the entire motion. °· Hold this position for __________ seconds. °Have  your muscles resist the band/tubing as it slowly pulls your foot back to the starting position.  °Repeat __________ times. Complete this exercise __________ times per day.  °STRENGTH - Ankle Inversion °· Secure one end of a rubber exercise band/tubing to a fixed object (table, pole). Loop the other end around your foot just before your toes. °· Place your fists between your knees. This will focus your strengthening at your ankle. °· Slowly, pull your big toe up and in, making sure the band/tubing is positioned to resist the entire motion. °· Hold this position for __________ seconds. °· Have your muscles resist the band/tubing as it slowly pulls your foot back to the starting position. °Repeat __________ times. Complete this exercises __________ times per day.  °STRENGTH - Towel Curls °· Sit in a chair positioned on a non-carpeted surface. °· Place your right / left foot on a towel, keeping your heel on the floor. °· Pull the towel toward your heel by only curling your toes. Keep your heel on the floor. °· If instructed by your physician, physical therapist,   or athletic trainer, add weight to the end of the towel. Repeat __________ times. Complete this exercise __________ times per day.   This information is not intended to replace advice given to you by your health care provider. Make sure you discuss any questions you have with your health care provider.   Document Released: 03/03/2005 Document Revised: 08/23/2014 Document Reviewed: 11/14/2008 Elsevier Interactive Patient Education 2016 [employer]   Take pain medicine, and ibuprofen for swelling. Use crutches and remain nonweightbearing until pain improves. Follow-up with orthopedist or your foot doctor next week for further evaluation.

## 2016-01-03 DIAGNOSIS — S93402A Sprain of unspecified ligament of left ankle, initial encounter: Secondary | ICD-10-CM | POA: Diagnosis not present

## 2016-01-08 ENCOUNTER — Other Ambulatory Visit: Payer: Self-pay | Admitting: Family Medicine

## 2016-01-08 DIAGNOSIS — F411 Generalized anxiety disorder: Secondary | ICD-10-CM

## 2016-01-08 DIAGNOSIS — J302 Other seasonal allergic rhinitis: Secondary | ICD-10-CM

## 2016-01-09 NOTE — Telephone Encounter (Signed)
Printed, please fax or call in to pharmacy. Thank you.   

## 2016-01-15 ENCOUNTER — Ambulatory Visit (INDEPENDENT_AMBULATORY_CARE_PROVIDER_SITE_OTHER): Payer: 59 | Admitting: Family Medicine

## 2016-01-15 DIAGNOSIS — E538 Deficiency of other specified B group vitamins: Secondary | ICD-10-CM | POA: Diagnosis not present

## 2016-01-15 MED ORDER — CYANOCOBALAMIN 1000 MCG/ML IJ SOLN
1000.0000 ug | Freq: Once | INTRAMUSCULAR | Status: AC
  Administered 2016-01-15: 1000 ug via INTRAMUSCULAR

## 2016-01-15 NOTE — Progress Notes (Signed)
Patient presented in office today to receive her monthly B12 injection, patient stated she had no questions or concerns today and reports that she is feeling well. Medication was administered into right deltoid muscle. Patient tolerated injection well and was advised to return back to office in one month.

## 2016-01-20 ENCOUNTER — Ambulatory Visit (INDEPENDENT_AMBULATORY_CARE_PROVIDER_SITE_OTHER): Payer: 59

## 2016-01-20 ENCOUNTER — Ambulatory Visit (INDEPENDENT_AMBULATORY_CARE_PROVIDER_SITE_OTHER): Payer: 59 | Admitting: Podiatry

## 2016-01-20 ENCOUNTER — Encounter: Payer: Self-pay | Admitting: Podiatry

## 2016-01-20 DIAGNOSIS — S93409A Sprain of unspecified ligament of unspecified ankle, initial encounter: Secondary | ICD-10-CM | POA: Insufficient documentation

## 2016-01-20 DIAGNOSIS — S93402A Sprain of unspecified ligament of left ankle, initial encounter: Secondary | ICD-10-CM | POA: Diagnosis not present

## 2016-01-20 DIAGNOSIS — R52 Pain, unspecified: Secondary | ICD-10-CM | POA: Diagnosis not present

## 2016-01-20 NOTE — Patient Instructions (Signed)

## 2016-01-20 NOTE — Progress Notes (Signed)
Subjective:     Patient ID: Lisa Alvarado, female   DOB: 02-21-1961, 55 y.o.   MRN: [id number]  HPI 55 year old female presents the office today for concerns in the left ankle pain which started to be sick ago after she fell twisting her ankle she does cause an inversion type injury. She went to the emergency room she associate ankle sprain and given Ace bandage. She states that she continues to have pain to the area although it has improved somewhat. Should to have a second opinion on the x-ray and have new x-rays taken. She has not had any treatments that showed the emergency room. No other complaints at this time. No other and is at the time of the accident.  Review of Systems  All other systems reviewed and are negative.      Objective:   Physical Exam General: AAO x3, NAD  Dermatological: Skin is warm, dry and supple bilateral. Nails x 10 are well manicured; remaining integument appears unremarkable at this time. There are no open sores, no preulcerative lesions, no rash or signs of infection present.  Vascular: Dorsalis Pedis artery and Posterior Tibial artery pedal pulses are 2/4 bilateral with immedate capillary fill time. Pedal hair growth present. No varicosities and no lower extremity edema present bilateral. There is no pain with calf compression, swelling, warmth, erythema.   Neruologic: Grossly intact via light touch bilateral. Vibratory intact via tuning fork bilateral. Protective threshold with Semmes Wienstein monofilament intact to all pedal sites bilateral. Patellar and Achilles deep tendon reflexes 2+ bilateral. No Babinski or clonus noted bilateral.   Musculoskeletal:There is mild tenderness palpation along the ATFL CFL the left ankle subjective she feels the pain has gotten better since the last couple weeks although does continue. There is localized edema to this area without any erythema or increase in warmth. There is no apparent PTF L. There is no pinpoint bony  tenderness or pain the vibratory sensation. No pain to the medial ankle ligaments. No pain to the fifth metatarsal base or other areas of the foot. No other areas of edema. MMT 5/5.Marland Kitchen.   Gait: Unassisted, Nonantalgic.      Assessment:     Left ankle sprain, possible old avulsion of the distal fibula    Plan:     -Treatment options discussed including all alternatives, risks, and complications -X-rays were obtained and reviewed with the patient. No definitive evidence of acute fracture. There is questionable area old avulsion to the distal fibula. -At this time recommended immobilization in an ankle brace. Once the pain starts to subside she can start range of motion, rehabilitation exercises for ankle sprain. -Continue meloxicam.  -Ice -Follow-up as scheduled. Call if questions concerns in the meantime.  Ovid CurdMatthew Wagoner, DPM

## 2016-01-26 ENCOUNTER — Encounter: Payer: Self-pay | Admitting: Podiatry

## 2016-01-26 ENCOUNTER — Telehealth: Payer: Self-pay | Admitting: Podiatry

## 2016-01-26 ENCOUNTER — Telehealth: Payer: Self-pay | Admitting: *Deleted

## 2016-01-26 DIAGNOSIS — S93402A Sprain of unspecified ligament of left ankle, initial encounter: Secondary | ICD-10-CM

## 2016-01-26 NOTE — Telephone Encounter (Signed)
Pt presented to the [facility], states she would like to go back to work tomorrow, there are classes she needs and can't miss. Dr. Bary CastillaWagoner okayed return to work in athletic shoes, with 15 minute seated duties per hour.  I gave these orders to T. Honeycutt, and she wrote the note for pt.

## 2016-01-26 NOTE — Telephone Encounter (Signed)
Pt state that Dr Ardelle AntonWagoner would write a note to release her to go back to work if she wanted to go back. Pt wants to go back 01/27/2016. Also wants to know if he can put on that note if she could wear a lighter shoe to work

## 2016-02-03 ENCOUNTER — Encounter: Payer: Self-pay | Admitting: Podiatry

## 2016-02-03 ENCOUNTER — Ambulatory Visit (INDEPENDENT_AMBULATORY_CARE_PROVIDER_SITE_OTHER): Payer: 59 | Admitting: Podiatry

## 2016-02-03 DIAGNOSIS — S93402A Sprain of unspecified ligament of left ankle, initial encounter: Secondary | ICD-10-CM | POA: Diagnosis not present

## 2016-02-03 DIAGNOSIS — M792 Neuralgia and neuritis, unspecified: Secondary | ICD-10-CM

## 2016-02-03 MED ORDER — METHYLPREDNISOLONE 4 MG PO TBPK: ORAL_TABLET | ORAL | Status: DC

## 2016-02-04 NOTE — Progress Notes (Signed)
Patient ID: Lisa Alvarado, female   DOB: 11-03-1960, 55 y.o.   MRN: [id number]  Subjective: She presents the office today for follow-up dilation status post left ankle sprain after an inversion ankle injury. She's been wearing the brace. She does that she has improved compared to last bone although she does continue to have pain. She states that she cannot go back to work with any restrictions so therefore she is been off of work. She states that she is on her feet for 4 hours a day she is having quite a bit of pain she works 12 hour shifts. She does still get numbness the top of her foot into the front of her ankle as well times. The ankle does swell still but improved. Denies any systemic complaints such as fevers, chills, nausea, vomiting. No acute changes since last appointment, and no other complaints at this time.   Objective: AAO x3, NAD DP/PT pulses palpable bilaterally, CRT less than 3 seconds There is continued decreased tenderness in the course the ATFL the left ankle. There is no pain on the other ankle ligaments this time. Is no gross instability ankle. There is no area pinpoint bony tenderness or pain the vibratory sensation to the ankle or foot. No pain on the course the peroneal tendons. Mild edema to the area. There is a positive Tinel sign on the course of the superficial peroneal nerve on the anterior aspect of the ankle. No areas of pinpoint bony tenderness or pain with vibratory sensation. MMT 5/5, ROM WNL. No edema, erythema, increase in warmth to bilateral lower extremities.  No open lesions or pre-ulcerative lesions.  No pain with calf compression, swelling, warmth, erythema  Assessment: Left ankle sprain, neuritis  Plan: -All treatment options discussed with the patient including all alternatives, risks, complications.  -Prescribed Medrol Dosepak. -dispensed compression anklet. Ankle brace as needed. Will start physical therapy as well. Prescription provided today. -See  her back in 2 weeks and hopefully return to work. Since I will be out of the office she does not want to wait 3 weeks and I will have her follow-up with Dr. Marylene LandStover. At that point if doing well return to work otherwise that she continues to have pain re-x-ray and possible MRI. -Patient encouraged to call the office with any questions, concerns, change in symptoms.   Ovid CurdMatthew Grigor Lipschutz, DPM

## 2016-02-13 ENCOUNTER — Ambulatory Visit: Payer: 59

## 2016-02-20 ENCOUNTER — Ambulatory Visit: Payer: 59 | Admitting: Sports Medicine

## 2016-02-24 ENCOUNTER — Ambulatory Visit (INDEPENDENT_AMBULATORY_CARE_PROVIDER_SITE_OTHER): Payer: 59 | Admitting: Podiatry

## 2016-02-24 ENCOUNTER — Ambulatory Visit: Payer: 59

## 2016-02-24 ENCOUNTER — Encounter: Payer: Self-pay | Admitting: Podiatry

## 2016-02-24 VITALS — BP 126/97 | HR 69 | Resp 12

## 2016-02-24 DIAGNOSIS — S93402A Sprain of unspecified ligament of left ankle, initial encounter: Secondary | ICD-10-CM

## 2016-02-24 DIAGNOSIS — M792 Neuralgia and neuritis, unspecified: Secondary | ICD-10-CM | POA: Diagnosis not present

## 2016-02-25 MED ORDER — NONFORMULARY OR COMPOUNDED ITEM: Status: DC

## 2016-02-27 NOTE — Progress Notes (Signed)
Patient ID: Felipe Droneheryl A Cragg, female   DOB: April 17, 1961, 55 y.o.   MRN: [id number]  Subjective: She presents the office today for follow-up evaluation of left equal pain. She says the ankle is doing better however she is getting still some nerve sensations the top of her foot and to the front of her ankle. 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 She states over her work boot does put pressure to the nerve. Denies any systemic complaints such as fevers, chills, nausea, vomiting. No acute changes since last appointment, and no other complaints at this time.   Objective: AAO x3, NAD DP/PT pulses palpable bilaterally, CRT less than 3 seconds There is minimal tenderness to palpation along the course the ATFL. His other ankle ligaments. There does continue to be some tenderness and reproduction of symptoms on the course of the superficial peroneal nerve and there is minimal edema to the area and there is no erythema or increase in warmth. There is no gross ankle instability present. No areas of pinpoint bony tenderness or pain with vibratory sensation. MMT 5/5, ROM WNL. No edema, erythema, increase in warmth to bilateral lower extremities.  No open lesions or pre-ulcerative lesions.  No pain with calf compression, swelling, warmth, erythema  Assessment: Left ankle sprain, neuritis  Plan: -All treatment options discussed with the patient including all alternatives, risks, complications.  -As she is still having symptoms that discussed the small amount of steroid around the nerve. She agrees understanding risks and, complications. Under sterile conditions quarters cc Dexon is an/oh was infiltrated around the symptomatic area without couple complications. Postinjection care with discussed. -Ordered for neuritis -Anti-inflammatories -Ankle brace as needed -Discussed that she can try wearing a thick sock with her work boot to see if this helps. When she is able to wear  her boot to work she can return -Follow-up as scheduled or sooner if needed.  Ovid CurdMatthew Jeovanni Heuring, DPM

## 2016-03-03 ENCOUNTER — Ambulatory Visit: Payer: 59

## 2016-03-11 ENCOUNTER — Ambulatory Visit (INDEPENDENT_AMBULATORY_CARE_PROVIDER_SITE_OTHER): Payer: 59 | Admitting: Podiatry

## 2016-03-11 ENCOUNTER — Encounter: Payer: Self-pay | Admitting: Podiatry

## 2016-03-11 VITALS — BP 130/90 | HR 86 | Resp 12

## 2016-03-11 DIAGNOSIS — M792 Neuralgia and neuritis, unspecified: Secondary | ICD-10-CM | POA: Diagnosis not present

## 2016-03-11 DIAGNOSIS — S93402D Sprain of unspecified ligament of left ankle, subsequent encounter: Secondary | ICD-10-CM | POA: Diagnosis not present

## 2016-03-11 MED ORDER — GABAPENTIN 100 MG PO CAPS: 100.0000 mg | ORAL_CAPSULE | Freq: Every day | ORAL | 3 refills | Status: DC

## 2016-03-11 NOTE — Progress Notes (Signed)
Patient ID: Lisa Alvarado, female   DOB: March 11, 1961, 55 y.o.   MRN: [id number]  Subjective: She presents the office today for follow-up evaluation of left ankle pain. She states the ankle set she sustained does not cause any problems I'll as she still gets nerve pain to the top of her foot she points along the distribution of the superficial peroneal nerve. Because of this she is unable to return to work. No other complaints this time and no new concerns.  Objective: AAO x3, NAD DP/PT pulses palpable bilaterally, CRT less than 3 seconds There is very minimal tenderness to palpation along the course the ATFL. His other ankle ligaments. There is no gross ankle instability present. There is positive Tinel sign on the course the superficial peroneal nerve on the anterior aspect of the ankle. There is no other areas of tenderness to the foot or ankle. Ankle Thomas of the joint range of motion is intact. No edema, erythema, increase in warmth to bilateral lower extremities.  No open lesions or pre-ulcerative lesions.  No pain with calf compression, swelling, warmth, erythema  Assessment: Left ankle sprain which is resolving, neuritis which continues   Plan: -All treatment options discussed with the patient including all alternatives, risks, complications.  -At this time we'll try gabapentin. Will start 100 mg at nighttime child basis. Discussed side effects the medication. -Discussed physical therapy. Prescription was provided today. -Will try ankle brace for immobilization in a sneaker. -Follow-up in 2 weeks or sooner if needed. Call question concerns in the meantime.  Ovid Curd, DPM

## 2016-03-19 ENCOUNTER — Telehealth: Payer: Self-pay | Admitting: Podiatry

## 2016-03-19 NOTE — Telephone Encounter (Signed)
Pt came in about her trilok brace she said that dr. Ardelle Anton told her to wear it with a thick shoe, but she states she cannot get it on with a thick sock wanted to know what she could do or if she can get a bigger size brace.

## 2016-03-19 NOTE — Telephone Encounter (Signed)
I left message informing pt to wear a sock to disperse the pressure of the straps for comfort and did not need to be a thick sock.

## 2016-03-25 ENCOUNTER — Ambulatory Visit: Payer: 59 | Admitting: Podiatry

## 2016-03-30 ENCOUNTER — Other Ambulatory Visit: Payer: Self-pay | Admitting: Family Medicine

## 2016-03-30 DIAGNOSIS — E7800 Pure hypercholesterolemia, unspecified: Secondary | ICD-10-CM

## 2016-04-01 ENCOUNTER — Telehealth: Payer: Self-pay | Admitting: *Deleted

## 2016-04-01 ENCOUNTER — Encounter: Payer: Self-pay | Admitting: Podiatry

## 2016-04-01 ENCOUNTER — Ambulatory Visit (INDEPENDENT_AMBULATORY_CARE_PROVIDER_SITE_OTHER): Payer: 59 | Admitting: Podiatry

## 2016-04-01 DIAGNOSIS — M792 Neuralgia and neuritis, unspecified: Secondary | ICD-10-CM | POA: Diagnosis not present

## 2016-04-01 DIAGNOSIS — T148XXA Other injury of unspecified body region, initial encounter: Secondary | ICD-10-CM

## 2016-04-01 DIAGNOSIS — S93402D Sprain of unspecified ligament of left ankle, subsequent encounter: Secondary | ICD-10-CM

## 2016-04-01 NOTE — Telephone Encounter (Addendum)
-----   Message from Vivi BarrackMatthew R Wagoner, DPM sent at 04/01/2016 12:16 PM EDT ----- Can you order an MRI of the left ankle to look for lateral ankle ligament tear and also she has numbness to the top of foot/ankle. Orders given to D. Meadows for [employer]. 04/05/2016-[employer] [facility] APPROVED MRI [id number] LEFT ANKLE, NOTIFICATION #[id number], VALID 04/02/2016 - 05/17/2016. [facility] 05/07/2016-Faxed request for copy of MRI left ankle disc. 05/14/2016-Faxed 2nd request for copy of MRI disc. Informed pt of the delay. 05/18/2016-Mailed MRI disc copy to [facility].

## 2016-04-05 NOTE — Progress Notes (Signed)
Patient ID: Lisa Alvarado, female   DOB: 06-22-1961, 55 y.o.   MRN: [id number]  Subjective: She presents the office today for follow-up evaluation of left ankle pain. He said that she was doing well to serial back to work. She is having definitively one up and down steps when she is not doing steps she was having improved pain. He is having difficulty working to the pain still this time. No other complaints this time and no new concerns.  Objective: AAO x3, NAD DP/PT pulses palpable bilaterally, CRT less than 3 seconds There is minimal but continued tenderness to palpation along the course the ATFL. This also continued pain along the course of the nerve on the superficial care nail nerve on the anterior aspect of the ankle. There is mild discomfort ankle joint range of motion. Is no gross ankle instability present at this time. There is no area of specific pinpoint bony tenderness or pain the vibratory sensation. There is trace edema to the area any erythema or increase in warmth. No other lesions or pre-ulcerative lesions. There is no calf Compression, swelling, warmth, erythema.  Assessment: Left ankle sprain which is resolving, neuritis which continues   Plan: -All treatment options discussed with the patient including all alternatives, risks, complications.  -At this time given the continued pain recommended a MRI which is ordered today. She states that she's having difficulty, do works of therefore we'll have to hold off on work. Continue with CAM boot for now as well as elevation and ice to the area. Follow-up with me after the MRI or sooner if any issues are to arise. Call any questions or concerns in the meantime.  Ovid CurdMatthew Stormy Connon, DPM

## 2016-04-06 ENCOUNTER — Other Ambulatory Visit: Payer: Self-pay | Admitting: Physician Assistant

## 2016-04-15 ENCOUNTER — Ambulatory Visit
Admission: RE | Admit: 2016-04-15 | Discharge: 2016-04-15 | Disposition: A | Discharge status: Home / Self Care | Payer: 59 | Source: Ambulatory Visit | Attending: Podiatry | Admitting: Podiatry

## 2016-04-15 DIAGNOSIS — R200 Anesthesia of skin: Secondary | ICD-10-CM | POA: Insufficient documentation

## 2016-04-15 DIAGNOSIS — S93492A Sprain of other ligament of left ankle, initial encounter: Secondary | ICD-10-CM | POA: Insufficient documentation

## 2016-04-15 DIAGNOSIS — M19072 Primary osteoarthritis, left ankle and foot: Secondary | ICD-10-CM | POA: Insufficient documentation

## 2016-04-15 DIAGNOSIS — Z9889 Other specified postprocedural states: Secondary | ICD-10-CM | POA: Insufficient documentation

## 2016-04-25 ENCOUNTER — Other Ambulatory Visit: Payer: Self-pay | Admitting: Physician Assistant

## 2016-04-25 DIAGNOSIS — E559 Vitamin D deficiency, unspecified: Secondary | ICD-10-CM

## 2016-04-26 ENCOUNTER — Telehealth: Payer: Self-pay

## 2016-04-26 NOTE — Telephone Encounter (Signed)
Spoke with pt regarding MRI results, she had further questions about the results, advised her to keep her appt with dr Ardelle AntonWagoner, she wanted an earlier appt, transferred her to the schedulers   She did state that she had not gone back to work as of yet, that she was still having some pain in her ankle but mostly when walking up- the stairs, she is working well with her PT exercises, but pressure on the foot causes her pain when not on a flat surface. She also states that the nerve pain in that foot has improved.

## 2016-05-04 ENCOUNTER — Other Ambulatory Visit: Payer: Self-pay | Admitting: Physician Assistant

## 2016-05-07 ENCOUNTER — Ambulatory Visit (INDEPENDENT_AMBULATORY_CARE_PROVIDER_SITE_OTHER): Payer: 59 | Admitting: Podiatry

## 2016-05-07 ENCOUNTER — Encounter: Payer: Self-pay | Admitting: Podiatry

## 2016-05-07 DIAGNOSIS — T148 Other injury of unspecified body region: Secondary | ICD-10-CM | POA: Diagnosis not present

## 2016-05-07 DIAGNOSIS — T148XXA Other injury of unspecified body region, initial encounter: Secondary | ICD-10-CM

## 2016-05-07 DIAGNOSIS — M779 Enthesopathy, unspecified: Secondary | ICD-10-CM

## 2016-05-07 DIAGNOSIS — S93402D Sprain of unspecified ligament of left ankle, subsequent encounter: Secondary | ICD-10-CM

## 2016-05-13 ENCOUNTER — Ambulatory Visit: Payer: 59 | Admitting: Podiatry

## 2016-05-13 NOTE — Progress Notes (Signed)
Subjective: 55 year old female presents the office in follow-up evaluation of left ankle pain and to discuss MRI results. She states that the numbness that she is having has been much improved with a compound cream that she still some throbbing discomfort to her ankle and she's having difficulty walking up and down steps. Because of this she has been unable to return to work. Denies any systemic complaints such as fevers, chills, nausea, vomiting. No acute changes since last appointment, and no other complaints at this time.   Objective: AAO x3, NAD DP/PT pulses palpable bilaterally, CRT less than 3 seconds There is mild tenderness on the course the ATFL how the majority tenderness appears to be on the ankle joint today. She started he gets some discomfort on the course of the peroneal tendon lateral ankle posterior to the lateral malleolus. There is no specific area pinpoint tenderness there is no pain vibratory sensation. There is no overlying edema, erythema, increase in warmth bilaterally. No open lesions or pre-ulcerative lesions.  No pain with calf compression, swelling, warmth, erythema  Assessment: Continued right ankle pain, sprain  Plan: -All treatment options discussed with the patient including all alternatives, risks, complications.  -At this time discussed a steroid injection into the ankle on the area of maximal tenderness which is appearing to be on the lateral gutter. Understanding risks and complications she wishes to proceed. Under sterile conditions a mixture of dexamethasone and local anesthetic was infiltrated without complications. Post injection care was discussed. The ankle brace as needed as well as supportive shoe gear. Continue ice and elevation as well as ankle sprain range of motion, rehabilitation exercises. Over the next week if she is not having a significant relief of symptoms I do recommend a second opinion. And she agrees to this. -Patient encouraged to call the  office with any questions, concerns, change in symptoms.   Ovid CurdMatthew Wagoner, DPM

## 2016-06-29 ENCOUNTER — Other Ambulatory Visit: Payer: Self-pay | Admitting: Physician Assistant

## 2016-06-29 DIAGNOSIS — I10 Essential (primary) hypertension: Secondary | ICD-10-CM

## 2016-07-29 ENCOUNTER — Ambulatory Visit (INDEPENDENT_AMBULATORY_CARE_PROVIDER_SITE_OTHER): Payer: 59 | Admitting: Podiatry

## 2016-07-29 DIAGNOSIS — M7752 Other enthesopathy of left foot: Secondary | ICD-10-CM

## 2016-07-29 DIAGNOSIS — M659 Synovitis and tenosynovitis, unspecified: Secondary | ICD-10-CM

## 2016-07-29 DIAGNOSIS — M25572 Pain in left ankle and joints of left foot: Secondary | ICD-10-CM

## 2016-08-01 MED ORDER — BETAMETHASONE SOD PHOS & ACET 6 (3-3) MG/ML IJ SUSP: 3.0000 mg | Freq: Once | INTRAMUSCULAR | Status: DC
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
## 2016-08-27 ENCOUNTER — Ambulatory Visit (INDEPENDENT_AMBULATORY_CARE_PROVIDER_SITE_OTHER): Payer: 59 | Admitting: Podiatry

## 2016-08-27 ENCOUNTER — Encounter: Payer: Self-pay | Admitting: Podiatry

## 2016-08-27 DIAGNOSIS — M659 Synovitis and tenosynovitis, unspecified: Secondary | ICD-10-CM

## 2016-08-27 DIAGNOSIS — M7752 Other enthesopathy of left foot: Secondary | ICD-10-CM

## 2016-08-27 DIAGNOSIS — M25572 Pain in left ankle and joints of left foot: Secondary | ICD-10-CM

## 2016-08-27 DIAGNOSIS — Z9889 Other specified postprocedural states: Secondary | ICD-10-CM

## 2016-08-27 NOTE — Patient Instructions (Signed)

## 2016-09-01 MED ORDER — BETAMETHASONE SOD PHOS & ACET 6 (3-3) MG/ML IJ SUSP: 3.0000 mg | Freq: Once | INTRAMUSCULAR | Status: DC

## 2016-09-01 NOTE — Progress Notes (Signed)
   Subjective:  Patient presents today for follow-up evaluation of left ankle pain with synovitis and capsulitis. Patient has a history of a subtalar joint fusion to the left lower extremity. Surgery was performed in 2004 by Dr. Eulah PontMurphy with [facility]. Patient states that the injection she received last visit helped for approximately one week.  Objective / Physical Exam:  General:  The patient is alert and oriented x3 in no acute distress. Dermatology:  Skin is warm, dry and supple bilateral lower extremities. Negative for open lesions or macerations. Vascular:  Palpable pedal pulses bilaterally. No edema or erythema noted. Capillary refill within normal limits. Neurological:  Epicritic and protective threshold grossly intact bilaterally.  Musculoskeletal Exam:  Pain on palpation to the anterior lateral medial aspects of the patient's left ankle. Mild edema noted.  Range of motion within normal limits to all pedal and ankle joints bilateral with exception of the subtalar joint fusion to the left lower extremity.. Muscle strength 5/5 in all groups bilateral.     Assessment: #1 pain in left ankle #2 synovitis with capsulitis of left ankle #3 history of subtalar joint fusion left  Plan of Care:  #1 Patient was evaluated. #2 injection of 0.5 mL Celestone Soluspan injected in the patient's left ankle. #3 today I recommend surgical intervention which would include arthroscopic ankle synovectomy. Authorization for surgery was initiated today. All possible complications and details of the surgery were explained. All patient questions were answered. No guarantees were expressed or implied. #4 the patient is very had To advance of surgery due to the fact that she underwent extensive rehabilitation for the subtalar joint fusion and she was in cam boot for approximately 1 year after surgery. I explained to the patient that this surgery is completely different and will not  include arthrodesis or osseous reconstruction. The patient wants to talk to her husband. #5 return to clinic in 4 weeks, for follow-up in the office 1 week postop if she proceeds with procedure  Patient works in [employer]  Felecia ShellingBrent M. Mekiah Wahler, [place]PM [facility]  Dr. Felecia ShellingBrent M. Aqib Lough, DPM    [address]                                        [place], [place] [address]                Office [phone]  Fax [phone]

## 2016-09-15 DIAGNOSIS — M79673 Pain in unspecified foot: Secondary | ICD-10-CM

## 2016-09-22 ENCOUNTER — Encounter: Payer: 59 | Admitting: Physician Assistant

## 2016-09-23 ENCOUNTER — Other Ambulatory Visit: Payer: Self-pay | Admitting: Physician Assistant

## 2016-09-23 DIAGNOSIS — E7800 Pure hypercholesterolemia, unspecified: Secondary | ICD-10-CM

## 2016-09-24 ENCOUNTER — Ambulatory Visit (INDEPENDENT_AMBULATORY_CARE_PROVIDER_SITE_OTHER): Payer: 59 | Admitting: Podiatry

## 2016-09-24 ENCOUNTER — Encounter: Payer: Self-pay | Admitting: Podiatry

## 2016-09-24 VITALS — BP 106/77 | HR 100

## 2016-09-24 DIAGNOSIS — Z981 Arthrodesis status: Secondary | ICD-10-CM

## 2016-09-24 DIAGNOSIS — M659 Synovitis and tenosynovitis, unspecified: Secondary | ICD-10-CM

## 2016-09-24 DIAGNOSIS — M19072 Primary osteoarthritis, left ankle and foot: Secondary | ICD-10-CM

## 2016-09-24 DIAGNOSIS — M25572 Pain in left ankle and joints of left foot: Secondary | ICD-10-CM

## 2016-10-01 ENCOUNTER — Encounter: Payer: Self-pay | Admitting: Physician Assistant

## 2016-10-01 ENCOUNTER — Ambulatory Visit (INDEPENDENT_AMBULATORY_CARE_PROVIDER_SITE_OTHER): Payer: 59 | Admitting: Physician Assistant

## 2016-10-01 VITALS — BP 120/80 | HR 84 | Temp 98.4°F | Resp 16 | Ht 66.0 in | Wt 181.0 lb

## 2016-10-01 DIAGNOSIS — E7800 Pure hypercholesterolemia, unspecified: Secondary | ICD-10-CM | POA: Diagnosis not present

## 2016-10-01 DIAGNOSIS — R7303 Prediabetes: Secondary | ICD-10-CM | POA: Diagnosis not present

## 2016-10-01 DIAGNOSIS — Z1239 Encounter for other screening for malignant neoplasm of breast: Secondary | ICD-10-CM

## 2016-10-01 DIAGNOSIS — Z23 Encounter for immunization: Secondary | ICD-10-CM

## 2016-10-01 DIAGNOSIS — Z1231 Encounter for screening mammogram for malignant neoplasm of breast: Secondary | ICD-10-CM | POA: Diagnosis not present

## 2016-10-01 DIAGNOSIS — Z114 Encounter for screening for human immunodeficiency virus [HIV]: Secondary | ICD-10-CM | POA: Diagnosis not present

## 2016-10-01 DIAGNOSIS — E559 Vitamin D deficiency, unspecified: Secondary | ICD-10-CM | POA: Diagnosis not present

## 2016-10-01 DIAGNOSIS — I10 Essential (primary) hypertension: Secondary | ICD-10-CM

## 2016-10-01 DIAGNOSIS — Z0000 Encounter for general adult medical examination without abnormal findings: Secondary | ICD-10-CM

## 2016-10-01 NOTE — Patient Instructions (Addendum)
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
## 2016-10-02 LAB — LIPID PANEL
CHOLESTEROL TOTAL: 171 mg/dL (ref 100–199)
Chol/HDL Ratio: 3.6 ratio units (ref 0.0–4.4)
HDL: 48 mg/dL (ref >39)
LDL Calculated: 95 mg/dL (ref 0–99)
Triglycerides: 138 mg/dL (ref 0–149)
VLDL Cholesterol Cal: 28 mg/dL (ref 5–40)

## 2016-10-02 LAB — COMPREHENSIVE METABOLIC PANEL
ALK PHOS: 87 IU/L (ref 39–117)
ALT: 28 IU/L (ref 0–32)
AST: 22 IU/L (ref 0–40)
Albumin/Globulin Ratio: 2 (ref 1.2–2.2)
Albumin: 5.1 g/dL (ref 3.5–5.5)
BILIRUBIN TOTAL: 0.8 mg/dL (ref 0.0–1.2)
BUN / CREAT RATIO: 13 (ref 9–23)
BUN: 13 mg/dL (ref 6–24)
CHLORIDE: 92 mmol/L — AB (ref 96–106)
CO2: 25 mmol/L (ref 18–29)
CREATININE: 0.99 mg/dL (ref 0.57–1.00)
Calcium: 9.9 mg/dL (ref 8.7–10.2)
GFR calc Af Amer: 74 mL/min/{1.73_m2} (ref >59)
GFR calc non Af Amer: 64 mL/min/{1.73_m2} (ref >59)
GLOBULIN, TOTAL: 2.6 g/dL (ref 1.5–4.5)
Glucose: 110 mg/dL — ABNORMAL HIGH (ref 65–99)
POTASSIUM: 3.2 mmol/L — AB (ref 3.5–5.2)
SODIUM: 140 mmol/L (ref 134–144)
Total Protein: 7.7 g/dL (ref 6.0–8.5)

## 2016-10-02 LAB — CBC WITH DIFFERENTIAL/PLATELET
BASOS ABS: 0 10*3/uL (ref 0.0–0.2)
Basos: 0 %
EOS (ABSOLUTE): 0 10*3/uL (ref 0.0–0.4)
Eos: 0 %
Hematocrit: 45.7 % (ref 34.0–46.6)
Hemoglobin: 15.1 g/dL (ref 11.1–15.9)
Immature Grans (Abs): 0 10*3/uL (ref 0.0–0.1)
Immature Granulocytes: 0 %
LYMPHS ABS: 3 10*3/uL (ref 0.7–3.1)
Lymphs: 36 %
MCH: 30 pg (ref 26.6–33.0)
MCHC: 33 g/dL (ref 31.5–35.7)
MCV: 91 fL (ref 79–97)
MONOS ABS: 0.5 10*3/uL (ref 0.1–0.9)
Monocytes: 6 %
NEUTROS ABS: 4.7 10*3/uL (ref 1.4–7.0)
Neutrophils: 58 %
Platelets: 286 10*3/uL (ref 150–379)
RBC: 5.03 x10E6/uL (ref 3.77–5.28)
RDW: 14.1 % (ref 12.3–15.4)
WBC: 8.2 10*3/uL (ref 3.4–10.8)

## 2016-10-02 LAB — VITAMIN D 25 HYDROXY (VIT D DEFICIENCY, FRACTURES): VIT D 25 HYDROXY: 36.9 ng/mL (ref 30.0–100.0)

## 2016-10-02 LAB — TSH: TSH: 1.98 u[IU]/mL (ref 0.450–4.500)

## 2016-10-02 LAB — HEMOGLOBIN A1C
ESTIMATED AVERAGE GLUCOSE: 128 mg/dL
Hgb A1c MFr Bld: 6.1 % — ABNORMAL HIGH (ref 4.8–5.6)

## 2016-10-02 LAB — HIV ANTIBODY (ROUTINE TESTING W REFLEX): HIV SCREEN 4TH GENERATION: NONREACTIVE

## 2016-10-04 ENCOUNTER — Telehealth: Payer: Self-pay

## 2016-10-04 NOTE — Telephone Encounter (Signed)
-----   Message from Margaretann LovelessJennifer M Burnette, [place]-C sent at 10/02/2016  9:24 AM EST ----- All labs are fairly stable. Cholesterol has improved since last year. HgBA1c did increase to 6.1 from 5.8. Continue to work on healthy lifestyle modifications and limit carbs and sugars.

## 2016-10-04 NOTE — Telephone Encounter (Signed)
Patient was advised as directed below.  Thanks,  -Jacksen Isip 

## 2016-10-09 NOTE — Progress Notes (Signed)
   Subjective:  Patient presents today for follow-up evaluation of left ankle pain with synovitis and capsulitis. Patient has a history of a subtalar joint fusion to the left lower extremity. Surgery was performed in 2004 by Dr. Eulah [facility]Murphy with [facility]. Patient states that the injection she received last visit helped for approximately one week.  Objective / Physical Exam:  General:  The patient is alert and oriented x3 in no acute distress. Dermatology:  Skin is warm, dry and supple bilateral lower extremities. Negative for open lesions or macerations. Vascular:  Palpable pedal pulses bilaterally. No edema or erythema noted. Capillary refill within normal limits. Neurological:  Epicritic and protective threshold grossly intact bilaterally.  Musculoskeletal Exam:  Pain on palpation to the anterior lateral medial aspects of the patient's left ankle. Mild edema noted.  Range of motion within normal limits to all pedal and ankle joints bilateral with exception of the subtalar joint fusion to the left lower extremity.. Muscle strength 5/5 in all groups bilateral.     Assessment: #1 pain in left ankle #2 synovitis with capsulitis of left ankle #3 history of subtalar joint fusion left  Plan of Care:  #1 Patient was evaluated. #2 today we discussed the patient's condition and the likelihood that she will never be 100% to her left foot and ankle to permanent arthrodesis of the subtalar joint which creates additional stress from ankle joint. #3 follow-up on disability paperwork for the patient fax. #4 if the patient requires surgical intervention return to clinic for surgical consult would include ankle joint arthroscopy left lower extremity and possible removal of hardware. #5 return to clinic when necessary   Patient works in [employer]  Felecia ShellingBrent M. Evans, [place] [facility]  Dr. Felecia ShellingBrent M. Evans, DPM    [address]                                         [place], [place] [address]                Office [phone]  Fax [phone]

## 2016-12-28 ENCOUNTER — Other Ambulatory Visit: Payer: Self-pay | Admitting: Physician Assistant

## 2016-12-28 DIAGNOSIS — I10 Essential (primary) hypertension: Secondary | ICD-10-CM

## 2017-01-25 ENCOUNTER — Telehealth: Payer: Self-pay | Admitting: *Deleted

## 2017-01-25 NOTE — Telephone Encounter (Signed)
"  I need to speak with you about scheduling surgery on my ankle.  If you would, please give me a call back.  Thank you."

## 2017-01-27 NOTE — Telephone Encounter (Signed)
I'm returning your call.  How can I help you?  "Thanks for returning my call.  I want to schedule my surgery.  I been putting it off long enough."  Do you have a date in mind?  "What's his next available date?"  He can do it on March 03, 2017.  "That date will be fine.  What time will I need to be there and are there any other instructions?"  Someone from the surgical center will call you a day or two prior to surgery date with the arrival time.  We may have to [facility], if so, you will need to have a history and physical form filled out.  Do you still have the forms?  "I already had that done."  Was it within the past 30 days?  "No, it's been longer."  You're going to have to get one done if you have surgery [facility].  "[place] go [place] office to get the forms."

## 2017-02-03 NOTE — Telephone Encounter (Signed)
I'm calling to let you know [facility].  You will not have to have the physical.  It will not be done at [facility].  "Will you send me an email about where I need to go?"  You have a brochure in the bag that was given to you.  The address of the surgery center is on the back of that brochure.

## 2017-03-03 ENCOUNTER — Encounter: Payer: Self-pay | Admitting: Podiatry

## 2017-03-04 DIAGNOSIS — M19072 Primary osteoarthritis, left ankle and foot: Secondary | ICD-10-CM | POA: Diagnosis not present

## 2017-03-11 ENCOUNTER — Ambulatory Visit (INDEPENDENT_AMBULATORY_CARE_PROVIDER_SITE_OTHER): Payer: 59

## 2017-03-11 ENCOUNTER — Encounter: Payer: Self-pay | Admitting: Podiatry

## 2017-03-11 ENCOUNTER — Ambulatory Visit (INDEPENDENT_AMBULATORY_CARE_PROVIDER_SITE_OTHER): Payer: Self-pay | Admitting: Podiatry

## 2017-03-11 DIAGNOSIS — Z9889 Other specified postprocedural states: Secondary | ICD-10-CM

## 2017-03-13 NOTE — Progress Notes (Signed)
   Subjective:  Patient presents today status post ankle arthroscopic synovectomy to the left ankle. Date of surgery 03/04/2017. Patient states that she has a lot of swelling in the afternoon. She spent on her feet weightbearing in the immobilization cam boot. Patient is otherwise very optimistic and believes that the pain has improved.   Objective/Physical Exam Skin incisions appear to be well coapted with sutures and staples intact. No sign of infectious process noted. No dehiscence. No active bleeding noted. Moderate edema noted to the surgical extremity.  Assessment: 1. s/p ankle arthroscopic synovectomy left. DOS: 03/04/2017   Plan of Care:  1. Patient was evaluated.  2. Today wearing continue weightbearing in the immobilization cam boot for an additional week. Dry sterile dressings were applied. Keep the foot clean dry and intact until next visit. 3. Return to clinic in 1 week    Felecia ShellingBrent M. Evans, DPM [facility]  Dr. Felecia ShellingBrent M. Evans, DPM    [address]                                        [place], [place] [address]                Office [phone]) [phone]  Fax [phone](336) [phone]

## 2017-03-18 ENCOUNTER — Ambulatory Visit (INDEPENDENT_AMBULATORY_CARE_PROVIDER_SITE_OTHER): Payer: 59 | Admitting: Podiatry

## 2017-03-18 ENCOUNTER — Encounter: Payer: Self-pay | Admitting: Podiatry

## 2017-03-18 DIAGNOSIS — Z9889 Other specified postprocedural states: Secondary | ICD-10-CM | POA: Diagnosis not present

## 2017-03-18 NOTE — Progress Notes (Signed)
   Subjective:  Patient presents today status post  Arthroscopic ankle synovectomy to the left ankle. Date of surgery 03/04/2017. Patient states that she's had some increased soreness and swelling to her left ankle. She does admit to going to the mall yesterday where she did a lot of standing for long periods of time In the cam boot. Patient continues to be very optimistic and is overall very satisfied    Objective/Physical Exam Skin incisions appear to be well coapted with sutures intact. No sign of infectious process noted. No dehiscence. No active bleeding noted. Moderate edema noted to the surgical extremity with some diffuse ecchymosis noted to the medial aspect of the ankle.   Assessment: 1. s/p  Ankle arthroscopic synovectomy left. Date of surgery 03/04/2017  Plan of Care:  1. Patient was evaluated.  2.  Sutures were removed today followed by antibiotic ointment and a Band-Aid.  3. The patient can begin getting the foot wet showering.  4. Discontinue immobilization cam boot. Today we dispensed a postoperative shoe for weightbearing as tolerated  5. Compression anklet dispensed today  6. Return to clinic in 2 weeks   Felecia ShellingBrent M. Evans, DPM [facility]  Dr. Felecia ShellingBrent M. Evans, DPM    [address]                                        [place], [place] [address]                Office [phone]  Fax [phone]) [phone]

## 2017-03-28 NOTE — Progress Notes (Signed)
DOS 03/03/2017; Ankle Arthroscopic Synovectomy LT

## 2017-03-30 ENCOUNTER — Other Ambulatory Visit: Payer: Self-pay | Admitting: Physician Assistant

## 2017-03-30 DIAGNOSIS — E7800 Pure hypercholesterolemia, unspecified: Secondary | ICD-10-CM

## 2017-04-01 ENCOUNTER — Encounter: Payer: Self-pay | Admitting: Podiatry

## 2017-04-01 ENCOUNTER — Ambulatory Visit (INDEPENDENT_AMBULATORY_CARE_PROVIDER_SITE_OTHER): Payer: 59 | Admitting: Podiatry

## 2017-04-01 DIAGNOSIS — Z9889 Other specified postprocedural states: Secondary | ICD-10-CM

## 2017-04-01 DIAGNOSIS — M659 Synovitis and tenosynovitis, unspecified: Secondary | ICD-10-CM

## 2017-04-02 NOTE — Progress Notes (Signed)
   Subjective:  Patient presents today status post  Arthroscopic ankle synovectomy to the left ankle. Date of surgery 03/04/2017. Patient states that she has some chronic low-grade pain and a little bit of soreness on both sides of the left ankle. She also states that the ankle feels somewhat stiff. No other complaints at this time patient states the pain is much improved since before surgery.   Objective/Physical Exam Skin incisions healed. No sign of infectious process noted. No dehiscence. Edema to the left foot and ankle appears to be improved and minimal. There is some minimal pain on palpation to the medial and lateral aspects of the left ankle joint. Range of motion within normal limits. Neurovascular status intact  Assessment: 1. s/p  Ankle arthroscopic synovectomy left. Date of surgery 03/04/2017  Plan of Care:  1. Patient was evaluated.  2.  Today the patient can begin discontinuing the immobilization cam boot and transition into an ankle brace. Today an ankle brace was dispensed 3. Appointment with Raiford Noble, pedorthist for custom molded insoles 4. Begin wearing good supportive shoe gear. Patient can slowly increase activity 5. Return to clinic in 8 weeks   Felecia Shelling, DPM [facility]  Dr. Felecia Shelling, DPM    [address]                                        [place], [place] [address]                Office [phone]  Fax [phone]

## 2017-04-06 ENCOUNTER — Ambulatory Visit (INDEPENDENT_AMBULATORY_CARE_PROVIDER_SITE_OTHER): Payer: 59 | Admitting: Orthotics

## 2017-04-06 DIAGNOSIS — M659 Synovitis and tenosynovitis, unspecified: Secondary | ICD-10-CM

## 2017-04-06 DIAGNOSIS — Z981 Arthrodesis status: Secondary | ICD-10-CM

## 2017-04-06 DIAGNOSIS — M25572 Pain in left ankle and joints of left foot: Secondary | ICD-10-CM

## 2017-04-06 DIAGNOSIS — M792 Neuralgia and neuritis, unspecified: Secondary | ICD-10-CM

## 2017-04-06 NOTE — Progress Notes (Signed)
Patient came into today to be cast for Custom Foot Orthotics. Upon recommendation of Dr. Logan Bores  Patient presents with pes cavus, ankle sinovitus L Goals are ankle stability and arch support Plan vendor Richy

## 2017-04-27 ENCOUNTER — Encounter: Payer: 59 | Admitting: Orthotics

## 2017-05-04 ENCOUNTER — Ambulatory Visit: Payer: 59 | Admitting: Orthotics

## 2017-05-04 DIAGNOSIS — T148XXA Other injury of unspecified body region, initial encounter: Secondary | ICD-10-CM

## 2017-05-04 DIAGNOSIS — M779 Enthesopathy, unspecified: Secondary | ICD-10-CM

## 2017-05-04 DIAGNOSIS — Z981 Arthrodesis status: Secondary | ICD-10-CM

## 2017-05-04 NOTE — Progress Notes (Signed)
Patient came in today to pick up custom made foot orthotics.  The goals were accomplished and the patient reported no dissatisfaction with said orthotics.  Patient was advised of breakin period and how to report any issues. 

## 2017-05-06 NOTE — Telephone Encounter (Signed)
error 

## 2017-05-24 ENCOUNTER — Ambulatory Visit (INDEPENDENT_AMBULATORY_CARE_PROVIDER_SITE_OTHER): Payer: BLUE CROSS/BLUE SHIELD | Admitting: Podiatry

## 2017-05-24 DIAGNOSIS — Z9889 Other specified postprocedural states: Secondary | ICD-10-CM

## 2017-05-27 ENCOUNTER — Encounter: Payer: 59 | Admitting: Podiatry

## 2017-05-30 NOTE — Progress Notes (Signed)
   Subjective:  Patient presents today status post arthroscopic ankle synovectomy to the left ankle. Date of surgery 03/04/2017. She states her condition is improving. She reports some continued mild swelling. She states she has been wearing the insoles which were uncomfortable at first, but she reports she is breaking them in and they are feeling better. She denies any new complaints at this time. She is here for further evaluation and treatment.    Past Medical History:  Diagnosis Date  . Hypercholesteremia   . Hypertension   . Migraine      Objective/Physical Exam Skin incisions healed. No sign of infectious process noted. No dehiscence. Edema to the left foot and ankle appears to be improved and minimal. There is some minimal pain on palpation to the medial and lateral aspects of the left ankle joint. Range of motion within normal limits. Neurovascular status intact  Assessment: 1. s/p  Ankle arthroscopic synovectomy left. Date of surgery 03/04/2017  Plan of Care:  1. Patient was evaluated.  2. Return to full activity with no restrictions. 3. Continue wearing orthotics and good shoe gear. 4. Continue wearing compression anklet. 5. Return to clinic when necessary.    Felecia Shelling, DPM [facility]  Dr. Felecia Shelling, DPM    [address]                                        [place], [place] [address]                Office [phone]  Fax [phone]

## 2017-06-01 ENCOUNTER — Other Ambulatory Visit: Payer: Self-pay | Admitting: Physician Assistant

## 2017-07-15 ENCOUNTER — Other Ambulatory Visit: Payer: Self-pay | Admitting: Physician Assistant

## 2017-07-15 DIAGNOSIS — I10 Essential (primary) hypertension: Secondary | ICD-10-CM

## 2017-10-19 ENCOUNTER — Other Ambulatory Visit: Payer: Self-pay | Admitting: Physician Assistant

## 2017-10-19 DIAGNOSIS — E7800 Pure hypercholesterolemia, unspecified: Secondary | ICD-10-CM

## 2017-12-19 ENCOUNTER — Other Ambulatory Visit: Payer: Self-pay | Admitting: Physician Assistant

## 2017-12-19 DIAGNOSIS — M7989 Other specified soft tissue disorders: Secondary | ICD-10-CM

## 2018-01-22 ENCOUNTER — Other Ambulatory Visit: Payer: Self-pay | Admitting: Physician Assistant

## 2018-01-22 DIAGNOSIS — E7800 Pure hypercholesterolemia, unspecified: Secondary | ICD-10-CM

## 2018-01-22 DIAGNOSIS — I10 Essential (primary) hypertension: Secondary | ICD-10-CM

## 2019-02-22 ENCOUNTER — Telehealth: Payer: Self-pay | Admitting: Physician Assistant

## 2019-02-22 NOTE — Telephone Encounter (Signed)
Patient called because she is keeping her granddaughter who lives in [place]. The granddaughter had a headache and she gave her Ibuprofen. The child's father later told her to not give her Ibuprofen because he was told that it can weaken your immune system and make you more susceptible to Covid 19. The patient wants to know if this is accurate or not.  Please advise.  Thank you, [facility]

## 2019-02-22 NOTE — Telephone Encounter (Signed)
IBU is ok and we have been telling patients to use with tylenol for fevers.

## 2019-02-22 NOTE — Telephone Encounter (Signed)
Please review. Thanks!  

## 2019-02-23 NOTE — Telephone Encounter (Signed)
Patient was advised.  

## 2019-02-23 NOTE — Telephone Encounter (Signed)
LMOVM for pt to return call 

## 2019-04-19 ENCOUNTER — Ambulatory Visit: Payer: BC Managed Care – PPO | Admitting: Physician Assistant

## 2019-04-19 ENCOUNTER — Encounter: Payer: Self-pay | Admitting: Physician Assistant

## 2019-04-19 ENCOUNTER — Other Ambulatory Visit: Payer: Self-pay

## 2019-04-19 VITALS — BP 156/88 | HR 72 | Temp 97.1°F | Resp 16 | Ht 66.5 in | Wt 187.0 lb

## 2019-04-19 DIAGNOSIS — L299 Pruritus, unspecified: Secondary | ICD-10-CM

## 2019-04-19 MED ORDER — DOXEPIN HCL 10 MG PO CAPS: 10.0000 mg | ORAL_CAPSULE | Freq: Three times a day (TID) | ORAL | 0 refills | Status: DC

## 2019-04-19 NOTE — Patient Instructions (Signed)
Pruritus Pruritus is an itchy feeling on the skin. One of the most common causes is dry skin, but many different things can cause itching. Most cases of itching do not require medical attention. Sometimes itchy skin can turn into a rash. Follow these instructions at home: Skin care   Apply moisturizing lotion to your skin as needed. Lotion that contains petroleum jelly is best.  Take medicines or apply medicated creams only as told by your health care provider. This may include: ? Corticosteroid cream. ? Anti-itch lotions. ? Oral antihistamines.  Apply a cool, wet cloth (cool compress) to the affected areas.  Take baths with one of the following: ? Epsom salts. You can get these at your local pharmacy or grocery store. Follow the instructions on the packaging. ? Baking soda. Pour a small amount into the bath as told by your health care provider. ? Colloidal oatmeal. You can get this at your local pharmacy or grocery store. Follow the instructions on the packaging.  Apply baking soda paste to your skin. To make the paste, stir water into a small amount of baking soda until it reaches a paste-like consistency.  Do not scratch your skin.  Do not take hot showers or baths, which can make itching worse. A cool shower may help with itching as long as you apply moisturizing lotion after the shower.  Do not use scented soaps, detergents, perfumes, and cosmetic products. Instead, use gentle, unscented versions of these items. General instructions  Avoid wearing tight clothes.  Keep a journal to help find out what is causing your itching. Write down: ? What you eat and drink. ? What cosmetic products you use. ? What soaps or detergents you use. ? What you wear, including jewelry.  Use a humidifier. This keeps the air moist, which helps to prevent dry skin.  Be aware of any changes in your itchiness. Contact a health care provider if:  The itching does not go away after several days.   You are unusually thirsty or urinating more than normal.  Your skin tingles or feels numb.  Your skin or the white parts of your eyes turn yellow (jaundice).  You feel weak.  You have any of the following: ? Night sweats. ? Tiredness (fatigue). ? Weight loss. ? Abdominal pain. Summary  Pruritus is an itchy feeling on the skin. One of the most common causes is dry skin, but many different conditions and factors can cause itching.  Apply moisturizing lotion to your skin as needed. Lotion that contains petroleum jelly is best.  Take medicines or apply medicated creams only as told by your health care provider.  Do not take hot showers or baths. Do not use scented soaps, detergents, perfumes, or cosmetic products. This information is not intended to replace advice given to you by your health care provider. Make sure you discuss any questions you have with your health care provider. Document Released: 04/14/2011 Document Revised: 08/16/2017 Document Reviewed: 08/16/2017 Elsevier Patient Education  2020 Elsevier Inc. Doxepin capsules What is this medicine? DOXEPIN (DOX e pin) is used to treat depression and anxiety. This medicine may be used for other purposes; ask your health care provider or pharmacist if you have questions. COMMON BRAND NAME(S): Sinequan What should I tell my health care provider before I take this medicine? They need to know if you have any of these conditions:  bipolar disorder  difficulty passing urine  glaucoma  heart disease  if you frequently drink alcohol containing drinks  liver disease  lung or breathing disease, like asthma or sleep apnea  prostate trouble  schizophrenia  seizures  suicidal thoughts, plans, or attempt; a previous suicide attempt by you or a family member  an unusual or allergic reaction to doxepin, other medicines, foods, dyes, or preservatives  pregnant or trying to get pregnant  breast-feeding How should I use  this medicine? Take this medicine by mouth with a glass of water. Follow the directions on the prescription label. Take your doses at regular intervals. Do not take your medicine more often than directed. Do not stop taking this medicine suddenly except upon the advice of your doctor. Stopping this medicine too quickly may cause serious side effects or your condition may worsen. A special MedGuide will be given to you by the pharmacist with each prescription and refill. Be sure to read this information carefully each time. Talk to your pediatrician regarding the use of this medicine in children. While this drug may be prescribed for children as young as 12 years for selected conditions, precautions do apply. Overdosage: If you think you have taken too much of this medicine contact a poison control center or emergency room at once. NOTE: This medicine is only for you. Do not share this medicine with others. What if I miss a dose? If you miss a dose, take it as soon as you can. If it is almost time for your next dose, take only that dose. Do not take double or extra doses. What may interact with this medicine? Do not take this medicine with any of the following medications:  arsenic trioxide  certain medicines used to regulate abnormal heartbeat or to treat other heart conditions  cisapride  halofantrine  levomethadyl  linezolid  MAOIs like Carbex, Eldepryl, Marplan, Nardil, and Parnate  methylene blue  other medicines for mental depression  phenothiazines like perphenazine, thioridazine and chlorpromazine  pimozide  procarbazine  sparfloxacin  St. John's Wort This medicine may also interact with the following medications:  cimetidine  tolazamide  ziprasidone This list may not describe all possible interactions. Give your health care provider a list of all the medicines, herbs, non-prescription drugs, or dietary supplements you use. Also tell them if you smoke, drink  alcohol, or use illegal drugs. Some items may interact with your medicine. What should I watch for while using this medicine? Visit your doctor or health care professional for regular checks on your progress. It can take several days before you feel the full effect of this medicine. If you have been taking this medicine regularly for some time, do not suddenly stop taking it. You must gradually reduce the dose or you may get severe side effects. Ask your doctor or health care professional for advice. Even after you stop taking this medicine it can still affect your body for several days. Patients and their families should watch out for new or worsening thoughts of suicide or depression. Also watch out for sudden changes in feelings such as feeling anxious, agitated, panicky, irritable, hostile, aggressive, impulsive, severely restless, overly excited and hyperactive, or not being able to sleep. If this happens, especially at the beginning of treatment or after a change in dose, call your health care professional. Bonita QuinYou may get drowsy or dizzy. Do not drive, use machinery, or do anything that needs mental alertness until you know how this medicine affects you. Do not stand or sit up quickly, especially if you are an older patient. This reduces the risk of dizzy or fainting  spells. Alcohol may increase dizziness and drowsiness. Avoid alcoholic drinks. Do not treat yourself for coughs, colds, or allergies without asking your doctor or health care professional for advice. Some ingredients can increase possible side effects. Your mouth may get dry. Chewing sugarless gum or sucking hard candy, and drinking plenty of water may help. Contact your doctor if the problem does not go away or is severe. This medicine may cause dry eyes and blurred vision. If you wear contact lenses you may feel some discomfort. Lubricating drops may help. See your eye doctor if the problem does not go away or is severe. This medicine can  make you more sensitive to the sun. Keep out of the sun. If you cannot avoid being in the sun, wear protective clothing and use sunscreen. Do not use sun lamps or tanning beds/booths. What side effects may I notice from receiving this medicine? Side effects that you should report to your doctor or health care professional as soon as possible:  allergic reactions like skin rash, itching or hives, swelling of the face, lips, or tongue  anxious  breathing problems  changes in vision  confusion  elevated mood, decreased need for sleep, racing thoughts, impulsive behavior  eye pain  fast, irregular heartbeat  feeling faint or lightheaded, falls  feeling agitated, angry, or irritable  fever with increased sweating  hallucination, loss of contact with reality  seizures  stiff muscles  suicidal thoughts or other mood changes  tingling, pain, or numbness in the feet or hands  trouble passing urine or change in the amount of urine  trouble sleeping  unusually weak or tired  vomiting  yellowing of the eyes or skin Side effects that usually do not require medical attention (report to your doctor or health care professional if they continue or are bothersome):  change in sex drive or performance  change in appetite or weight  constipation  dizziness  dry mouth  nausea  tired  tremors  upset stomach This list may not describe all possible side effects. Call your doctor for medical advice about side effects. You may report side effects to FDA at 1-800-FDA-1088. Where should I keep my medicine? Keep out of the reach of children. Store at room temperature between 15 and 30 degrees C (59 and 86 degrees F). Throw away any unused medicine after the expiration date. NOTE: This sheet is a summary. It may not cover all possible information. If you have questions about this medicine, talk to your doctor, pharmacist, or health care provider.  2020 Elsevier/Gold Standard  (2018-07-25 13:13:29)

## 2019-04-19 NOTE — Progress Notes (Signed)
Patient: Felipe DroneCheryl A Nigg Female    DOB: [birth year]   58 y.o.   MRN: [id number] Visit Date: 04/19/2019  Today's Provider: Margaretann LovelessJennifer M Burnette, PA-C   No chief complaint on file.  Subjective:    I,[name],RMA am acting as a Neurosurgeonscribe for P[employer] IndustriesJennifer M. Burnette, PA-C.  HPI Patient here today with c/o her arms itching. Reports that she itches more mainly when she is hot. She reports having this 2-3 years ago. Now with worsening. She is having trouble sleeping because she is waking up scratching. No new detergent, soap,shampoo, any new food. She has tried Sarna, Benadryl, Hydrocortisone cream 1%, Aveno lotion, Benadryl tablets.  Allergies  Allergen Reactions  . Sulfa Antibiotics   . Sulfur Other (See Comments)  . Sulfamethoxazole-Trimethoprim Rash     Current Outpatient Medications:  .  levonorgestrel (MIRENA) 20 MCG/24HR IUD, by Intrauterine route., Disp: , Rfl:  .  ALPRAZolam (XANAX) 0.5 MG tablet, TAKE 1 TABLET BY MOUTH EVERY 8 HOURS AS NEEDED OR AT BEDTIME (Patient not taking: Reported on 04/19/2019), Disp: 30 tablet, Rfl: 5 .  cyanocobalamin (,VITAMIN B-12,) 1000 MCG/ML injection, Inject into the muscle., Disp: , Rfl:  .  furosemide (LASIX) 20 MG tablet, TAKE 1 TABLET(20 MG) BY MOUTH DAILY (Patient not taking: Reported on 04/19/2019), Disp: 90 tablet, Rfl: 1 .  hydrochlorothiazide (HYDRODIURIL) 25 MG tablet, TAKE 1 TABLET(25 MG) BY MOUTH DAILY (Patient not taking: Reported on 04/19/2019), Disp: 90 tablet, Rfl: 1 .  loratadine (CLARITIN) 10 MG tablet, TAKE 1 TABLET BY MOUTH DAILY. (Patient not taking: Reported on 10/01/2016), Disp: 30 tablet, Rfl: 12 .  meloxicam (MOBIC) 15 MG tablet, Take 15 mg by mouth daily., Disp: , Rfl:  .  montelukast (SINGULAIR) 10 MG tablet, TAKE 1 TABLET BY MOUTH DAILY (Patient not taking: Reported on 10/01/2016), Disp: 90 tablet, Rfl: 3 .  simvastatin (ZOCOR) 20 MG tablet, TAKE 1 TABLET(20 MG) BY MOUTH DAILY (Patient not taking: Reported on 04/19/2019),  Disp: 90 tablet, Rfl: 1 .  Vitamin D, Ergocalciferol, (DRISDOL) 50000 units CAPS capsule, TAKE 1 CAPSULE BY MOUTH EVERY 7 DAYS (Patient not taking: Reported on 04/19/2019), Disp: 4 capsule, Rfl: 3  Current Facility-Administered Medications:  .  betamethasone acetate-betamethasone sodium phosphate (CELESTONE) injection 3 mg, 3 mg, Intramuscular, Once, Evans, Brent M, DPM .  betamethasone acetate-betamethasone sodium phosphate (CELESTONE) injection 3 mg, 3 mg, Intramuscular, Once, Felecia ShellingEvans, Brent M, DPM  Review of Systems  Constitutional: Negative for appetite change, chills, fatigue and fever.  Respiratory: Negative for chest tightness and shortness of breath.   Cardiovascular: Positive for leg swelling (left ankle where the surgery was.). Negative for chest pain and palpitations.  Gastrointestinal: Negative for abdominal pain, diarrhea, nausea and vomiting.  Skin: Negative for rash.  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 Negative for weakness.  Psychiatric/Behavioral: Positive for sleep disturbance.    Social History   Tobacco Use  . Smoking status: Current Every Day Smoker    Types: E-cigarettes  . Smokeless tobacco: Never Used  Substance Use Topics  . Alcohol use: No    Alcohol/week: 0.0 standard drinks      Objective:   BP (!) 156/88 (BP Location: Left Arm, Patient Position: Sitting, Cuff Size: Large)   Pulse 72   Temp (!) 97.1 F (36.2 C) (Other (Comment)) Comment (Src): forehead  Resp 16   Ht 5' 6.5" (1.689 m)   Wt 187 lb (84.8 kg)   BMI 29.73 kg/m  Vitals:  04/19/19 1054  BP: (!) 156/88  Pulse: 72  Resp: 16  Temp: (!) 97.1 F (36.2 C)  TempSrc: Other (Comment)  Weight: 187 lb (84.8 kg)  Height: 5' 6.5" (1.689 m)  Body mass index is 29.73 kg/m.   Physical Exam Vitals signs reviewed.  Constitutional:      General: She is not in acute distress.    Appearance: Normal appearance. She is well-developed. She is not diaphoretic.   Neck:     Musculoskeletal: Normal range of motion and neck supple.     Thyroid: No thyromegaly.     Vascular: No JVD.     Trachea: No tracheal deviation.  Cardiovascular:     Rate and Rhythm: Normal rate and regular rhythm.     Heart sounds: Normal heart sounds. No murmur. No friction rub. No gallop.   Pulmonary:     Effort: Pulmonary effort is normal. No respiratory distress.     Breath sounds: Normal breath sounds. No wheezing or rales.  Lymphadenopathy:     Cervical: No cervical adenopathy.  Skin:    General: Skin is warm and dry.     Findings: No erythema, lesion or rash.  Neurological:     Mental Status: She is alert.      No results found for any visits on 04/19/19.     Assessment & Plan    1. Itching Unsure of cause. No rash, dryness. Will try doxepin as below. Advised keep skin well moisturized. I will see her back in 4-6 weeks for CPE and labs.  - doxepin (SINEQUAN) 10 MG capsule; Take 1 capsule (10 mg total) by mouth 4 (four) times daily -  with meals and at bedtime.  Dispense: 120 capsule; Refill: 0     Mar Daring, PA-C  [facility]

## 2019-05-31 ENCOUNTER — Encounter: Payer: BC Managed Care – PPO | Admitting: Physician Assistant
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
## 2019-06-07 ENCOUNTER — Encounter: Payer: Self-pay | Admitting: Physician Assistant

## 2019-06-07 ENCOUNTER — Other Ambulatory Visit: Payer: Self-pay

## 2019-06-07 ENCOUNTER — Ambulatory Visit (INDEPENDENT_AMBULATORY_CARE_PROVIDER_SITE_OTHER): Payer: BC Managed Care – PPO | Admitting: Physician Assistant

## 2019-06-07 ENCOUNTER — Other Ambulatory Visit (HOSPITAL_COMMUNITY)
Admission: RE | Admit: 2019-06-07 | Discharge: 2019-06-07 | Disposition: A | Discharge status: Home / Self Care | Payer: BC Managed Care – PPO | Source: Ambulatory Visit | Attending: Physician Assistant | Admitting: Physician Assistant

## 2019-06-07 VITALS — BP 153/87 | HR 70 | Temp 97.5°F | Resp 16 | Ht 66.0 in | Wt 190.2 lb

## 2019-06-07 DIAGNOSIS — I10 Essential (primary) hypertension: Secondary | ICD-10-CM

## 2019-06-07 DIAGNOSIS — J418 Mixed simple and mucopurulent chronic bronchitis: Secondary | ICD-10-CM

## 2019-06-07 DIAGNOSIS — Z124 Encounter for screening for malignant neoplasm of cervix: Secondary | ICD-10-CM

## 2019-06-07 DIAGNOSIS — E6609 Other obesity due to excess calories: Secondary | ICD-10-CM

## 2019-06-07 DIAGNOSIS — Z1231 Encounter for screening mammogram for malignant neoplasm of breast: Secondary | ICD-10-CM | POA: Diagnosis not present

## 2019-06-07 DIAGNOSIS — R7303 Prediabetes: Secondary | ICD-10-CM | POA: Diagnosis not present

## 2019-06-07 DIAGNOSIS — Z1211 Encounter for screening for malignant neoplasm of colon: Secondary | ICD-10-CM

## 2019-06-07 DIAGNOSIS — E66811 Obesity, class 1: Secondary | ICD-10-CM

## 2019-06-07 DIAGNOSIS — Z0000 Encounter for general adult medical examination without abnormal findings: Secondary | ICD-10-CM | POA: Diagnosis not present

## 2019-06-07 DIAGNOSIS — Z23 Encounter for immunization: Secondary | ICD-10-CM | POA: Diagnosis not present

## 2019-06-07 DIAGNOSIS — E7800 Pure hypercholesterolemia, unspecified: Secondary | ICD-10-CM | POA: Diagnosis not present

## 2019-06-07 DIAGNOSIS — L299 Pruritus, unspecified: Secondary | ICD-10-CM

## 2019-06-07 DIAGNOSIS — Z6830 Body mass index (BMI) 30.0-30.9, adult: Secondary | ICD-10-CM

## 2019-06-07 MED ORDER — HYDROXYZINE HCL 10 MG PO TABS: 10.0000 mg | ORAL_TABLET | Freq: Three times a day (TID) | ORAL | 0 refills | Status: DC | PRN

## 2019-06-07 NOTE — Patient Instructions (Signed)
Health Maintenance, Female Adopting a healthy lifestyle and getting preventive care are important in promoting health and wellness. Ask your health care provider about:  The right schedule for you to have regular tests and exams.  Things you can do on your own to prevent diseases and keep yourself healthy. What should I know about diet, weight, and exercise? Eat a healthy diet   Eat a diet that includes plenty of vegetables, fruits, low-fat dairy products, and lean protein.  Do not eat a lot of foods that are high in solid fats, added sugars, or sodium. Maintain a healthy weight Body mass index (BMI) is used to identify weight problems. It estimates body fat based on height and weight. Your health care provider can help determine your BMI and help you achieve or maintain a healthy weight. Get regular exercise Get regular exercise. This is one of the most important things you can do for your health. Most adults should:  Exercise for at least 150 minutes each week. The exercise should increase your heart rate and make you sweat (moderate-intensity exercise).  Do strengthening exercises at least twice a week. This is in addition to the moderate-intensity exercise.  Spend less time sitting. Even light physical activity can be beneficial. Watch cholesterol and blood lipids Have your blood tested for lipids and cholesterol at 58 years of age, then have this test every 5 years. Have your cholesterol levels checked more often if:  Your lipid or cholesterol levels are high.  You are older than 58 years of age.  You are at high risk for heart disease. What should I know about cancer screening? Depending on your health history and family history, you may need to have cancer screening at various ages. This may include screening for:  Breast cancer.  Cervical cancer.  Colorectal cancer.  Skin cancer.  Lung cancer. What should I know about heart disease, diabetes, and high blood  pressure? Blood pressure and heart disease  High blood pressure causes heart disease and increases the risk of stroke. This is more likely to develop in people who have high blood pressure readings, are of African descent, or are overweight.  Have your blood pressure checked: ? Every 3-5 years if you are 18-39 years of age. ? Every year if you are 40 years old or older. Diabetes Have regular diabetes screenings. This checks your fasting blood sugar level. Have the screening done:  Once every three years after age 40 if you are at a normal weight and have a low risk for diabetes.  More often and at a younger age if you are overweight or have a high risk for diabetes. What should I know about preventing infection? Hepatitis B If you have a higher risk for hepatitis B, you should be screened for this virus. Talk with your health care provider to find out if you are at risk for hepatitis B infection. Hepatitis C Testing is recommended for:  Everyone born from 1945 through 1965.  Anyone with known risk factors for hepatitis C. Sexually transmitted infections (STIs)  Get screened for STIs, including gonorrhea and chlamydia, if: ? You are sexually active and are younger than 58 years of age. ? You are older than 58 years of age and your health care provider tells you that you are at risk for this type of infection. ? Your sexual activity has changed since you were last screened, and you are at increased risk for chlamydia or gonorrhea. Ask your health care provider if   you are at risk.  Ask your health care provider about whether you are at high risk for HIV. Your health care provider may recommend a prescription medicine to help prevent HIV infection. If you choose to take medicine to prevent HIV, you should first get tested for HIV. You should then be tested every 3 months for as long as you are taking the medicine. Pregnancy  If you are about to stop having your period (premenopausal) and  you may become pregnant, seek counseling before you get pregnant.  Take 400 to 800 micrograms (mcg) of folic acid every day if you become pregnant.  Ask for birth control (contraception) if you want to prevent pregnancy. Osteoporosis and menopause Osteoporosis is a disease in which the bones lose minerals and strength with aging. This can result in bone fractures. If you are 65 years old or older, or if you are at risk for osteoporosis and fractures, ask your health care provider if you should:  Be screened for bone loss.  Take a calcium or vitamin D supplement to lower your risk of fractures.  Be given hormone replacement therapy (HRT) to treat symptoms of menopause. Follow these instructions at home: Lifestyle  Do not use any products that contain nicotine or tobacco, such as cigarettes, e-cigarettes, and chewing tobacco. If you need help quitting, ask your health care provider.  Do not use street drugs.  Do not share needles.  Ask your health care provider for help if you need support or information about quitting drugs. Alcohol use  Do not drink alcohol if: ? Your health care provider tells you not to drink. ? You are pregnant, may be pregnant, or are planning to become pregnant.  If you drink alcohol: ? Limit how much you use to 0-1 drink a day. ? Limit intake if you are breastfeeding.  Be aware of how much alcohol is in your drink. In the [place] one drink equals one 12 oz bottle of beer (355 mL), one 5 oz glass of wine (148 mL), or one 1 oz glass of hard liquor (44 mL). General instructions  Schedule regular health, dental, and eye exams.  Stay current with your vaccines.  Tell your health care provider if: ? You often feel depressed. ? You have ever been abused or do not feel safe at home. Summary  Adopting a healthy lifestyle and getting preventive care are important in promoting health and wellness.  Follow your health care provider's instructions about healthy  diet, exercising, and getting tested or screened for diseases.  Follow your health care provider's instructions on monitoring your cholesterol and blood pressure. This information is not intended to replace advice given to you by your health care provider. Make sure you discuss any questions you have with your health care provider. Document Released: 02/15/2011 Document Revised: 07/26/2018 Document Reviewed: 07/26/2018 Elsevier Patient Education  2020 [employer]  

## 2019-06-08 ENCOUNTER — Telehealth: Payer: Self-pay

## 2019-06-08 DIAGNOSIS — E7800 Pure hypercholesterolemia, unspecified: Secondary | ICD-10-CM

## 2019-06-08 LAB — TSH: TSH: 2.11 u[IU]/mL (ref 0.450–4.500)

## 2019-06-08 LAB — COMPREHENSIVE METABOLIC PANEL
ALT: 33 IU/L — ABNORMAL HIGH (ref 0–32)
AST: 29 IU/L (ref 0–40)
Albumin/Globulin Ratio: 1.7 (ref 1.2–2.2)
Albumin: 4.5 g/dL (ref 3.8–4.9)
Alkaline Phosphatase: 81 IU/L (ref 39–117)
BUN/Creatinine Ratio: 12 (ref 9–23)
BUN: 10 mg/dL (ref 6–24)
Bilirubin Total: 0.5 mg/dL (ref 0.0–1.2)
CO2: 17 mmol/L — ABNORMAL LOW (ref 20–29)
Calcium: 9.6 mg/dL (ref 8.7–10.2)
Chloride: 106 mmol/L (ref 96–106)
Creatinine, Ser: 0.83 mg/dL (ref 0.57–1.00)
GFR calc Af Amer: 90 mL/min/{1.73_m2} (ref >59)
GFR calc non Af Amer: 78 mL/min/{1.73_m2} (ref >59)
Globulin, Total: 2.6 g/dL (ref 1.5–4.5)
Glucose: 117 mg/dL — ABNORMAL HIGH (ref 65–99)
Potassium: 4 mmol/L (ref 3.5–5.2)
Sodium: 140 mmol/L (ref 134–144)
Total Protein: 7.1 g/dL (ref 6.0–8.5)

## 2019-06-08 LAB — LIPID PANEL
Chol/HDL Ratio: 5.6 ratio — ABNORMAL HIGH (ref 0.0–4.4)
Cholesterol, Total: 213 mg/dL — ABNORMAL HIGH (ref 100–199)
HDL: 38 mg/dL — ABNORMAL LOW (ref >39)
LDL Chol Calc (NIH): 135 mg/dL — ABNORMAL HIGH (ref 0–99)
Triglycerides: 222 mg/dL — ABNORMAL HIGH (ref 0–149)
VLDL Cholesterol Cal: 40 mg/dL (ref 5–40)

## 2019-06-08 LAB — CBC WITH DIFFERENTIAL/PLATELET
Basophils Absolute: 0.1 10*3/uL (ref 0.0–0.2)
Basos: 1 %
EOS (ABSOLUTE): 0.1 10*3/uL (ref 0.0–0.4)
Eos: 1 %
Hematocrit: 44.9 % (ref 34.0–46.6)
Hemoglobin: 15 g/dL (ref 11.1–15.9)
Immature Grans (Abs): 0 10*3/uL (ref 0.0–0.1)
Immature Granulocytes: 0 %
Lymphocytes Absolute: 2.4 10*3/uL (ref 0.7–3.1)
Lymphs: 38 %
MCH: 30.7 pg (ref 26.6–33.0)
MCHC: 33.4 g/dL (ref 31.5–35.7)
MCV: 92 fL (ref 79–97)
Monocytes Absolute: 0.4 10*3/uL (ref 0.1–0.9)
Monocytes: 6 %
Neutrophils Absolute: 3.5 10*3/uL (ref 1.4–7.0)
Neutrophils: 54 %
Platelets: 232 10*3/uL (ref 150–450)
RBC: 4.88 x10E6/uL (ref 3.77–5.28)
RDW: 12.9 % (ref 11.7–15.4)
WBC: 6.4 10*3/uL (ref 3.4–10.8)

## 2019-06-08 LAB — HEMOGLOBIN A1C
Est. average glucose Bld gHb Est-mCnc: 134 mg/dL
Hgb A1c MFr Bld: 6.3 % — ABNORMAL HIGH (ref 4.8–5.6)

## 2019-06-08 MED ORDER — ATORVASTATIN CALCIUM 20 MG PO TABS: 20.0000 mg | ORAL_TABLET | Freq: Every day | ORAL | 3 refills | Status: DC

## 2019-06-08 NOTE — Telephone Encounter (Signed)
-----   Message from Mar Daring, [place] sent at 06/08/2019 10:51 AM EDT ----- Blood count is normal. Kidney and liver function are normal. Sodium, potassium and calcium are normal. Cholesterol increased compared to two years ago. Currently your risk of having a cardiovascular event over the next 10 years is high at 11.6%. I would recommend starting a cholesterol lowering medication to help decrease your risk. With cholesterol lowering medication, smoking cessation and lifestyle modifications, you could lower this risk to 1.9%. If agreeable I can send in for you to try. A1c has also increased compared to 2 yrs ago to 6.3 from 6.1. Again healthy lifestyle modifications can help to decrease this number as this is considered prediabetic. Thyroid is normal.

## 2019-06-08 NOTE — Telephone Encounter (Signed)
Advised patient as below. Patient is willing to start medication. Please send into the pharmacy.   She also wanted to discuss treatment for migraine headaches. An appt was scheduled for her to come in and talk to you about this. Just FYI. Thanks!

## 2019-06-08 NOTE — Telephone Encounter (Signed)
Sent in atorvastatin 

## 2019-06-11 ENCOUNTER — Encounter: Payer: Self-pay | Admitting: Physician Assistant

## 2019-06-11 ENCOUNTER — Telehealth: Payer: Self-pay

## 2019-06-11 ENCOUNTER — Ambulatory Visit: Payer: BC Managed Care – PPO | Admitting: Physician Assistant

## 2019-06-11 ENCOUNTER — Other Ambulatory Visit: Payer: Self-pay

## 2019-06-11 VITALS — BP 138/85 | HR 96 | Temp 97.3°F | Wt 191.0 lb

## 2019-06-11 DIAGNOSIS — G43709 Chronic migraine without aura, not intractable, without status migrainosus: Secondary | ICD-10-CM | POA: Diagnosis not present

## 2019-06-11 DIAGNOSIS — M25562 Pain in left knee: Secondary | ICD-10-CM | POA: Diagnosis not present

## 2019-06-11 DIAGNOSIS — G8929 Other chronic pain: Secondary | ICD-10-CM

## 2019-06-11 DIAGNOSIS — F411 Generalized anxiety disorder: Secondary | ICD-10-CM | POA: Diagnosis not present

## 2019-06-11 DIAGNOSIS — F339 Major depressive disorder, recurrent, unspecified: Secondary | ICD-10-CM | POA: Diagnosis not present

## 2019-06-11 LAB — CYTOLOGY - PAP
Comment: NEGATIVE
Diagnosis: NEGATIVE
High risk HPV: NEGATIVE

## 2019-06-11 MED ORDER — SUMATRIPTAN-NAPROXEN SODIUM 85-500 MG PO TABS: 1.0000 | ORAL_TABLET | ORAL | 11 refills | Status: DC | PRN

## 2019-06-11 MED ORDER — ESCITALOPRAM OXALATE 10 MG PO TABS: 10.0000 mg | ORAL_TABLET | Freq: Every day | ORAL | 1 refills | Status: DC

## 2019-06-11 MED ORDER — ALPRAZOLAM 0.5 MG PO TABS: 0.5000 mg | ORAL_TABLET | Freq: Every evening | ORAL | 5 refills | Status: DC | PRN

## 2019-06-11 MED ORDER — MELOXICAM 7.5 MG PO TABS: 7.5000 mg | ORAL_TABLET | Freq: Every day | ORAL | 1 refills | Status: DC

## 2019-06-11 NOTE — Telephone Encounter (Signed)
[email or web address]

## 2019-06-11 NOTE — Telephone Encounter (Signed)
-----   Message from Mar Daring, [place] sent at 06/11/2019 11:44 AM EDT ----- Pap is normal, HPV negative.  Will repeat in 5 years.

## 2019-06-11 NOTE — Progress Notes (Signed)
Patient: Lisa Alvarado Female    DOB: [birth year]   58 y.o.   MRN: [id number] Visit Date: 06/11/2019  Today's Provider: Margaretann Loveless, PA-C   Chief Complaint  Patient presents with   Migraine   Anxiety   Leg Swelling   Subjective:     HPI  Patient here today c/o recurrent and worsening headaches x's several months. Patient reports dizziness, denies any nausea, vomiting or blurred vision. Patient reports headaches are present almost daily. Patient reports taking excedrine migraine, reports no pain control.   Patient c/o anxiety worsening in the last few months. Patient reports moody, and no interest of going or doing anything. Patient reports wanting to sleep in daily. Patient reports that several years ago she was taking anxiety medication.  Patient c/o swelling around knees and left ankle. Patient reports that when her knees are swollen her ankle will swell up also. Patient reports she dose have artharitis in her left ankle. Patient reports using ice to help with swelling. Patient reports swelling is present since the morning and stays swollen for a couple a days.    Allergies  Allergen Reactions   Sulfa Antibiotics    Sulfur Other (See Comments)   Sulfamethoxazole-Trimethoprim Rash     Current Outpatient Medications:    atorvastatin (LIPITOR) 20 MG tablet, Take 1 tablet (20 mg total) by mouth daily., Disp: 90 tablet, Rfl: 3   Boswellia-Glucosamine-Vit D (OSTEO BI-FLEX ONE PER DAY PO), Take by mouth., Disp: , Rfl:    hydrOXYzine (ATARAX/VISTARIL) 10 MG tablet, Take 1 tablet (10 mg total) by mouth 3 (three) times daily as needed for itching., Disp: 90 tablet, Rfl: 0   levonorgestrel (MIRENA) 20 MCG/24HR IUD, by Intrauterine route., Disp: , Rfl:   Review of Systems  Constitutional: Positive for activity change and fatigue.  Respiratory: Negative for cough, shortness of breath and wheezing.   Cardiovascular: Positive for leg swelling. Negative for  chest pain and palpitations.  Gastrointestinal: Negative.   Musculoskeletal: Positive for arthralgias and joint swelling.  Neurological: Negative.   Psychiatric/Behavioral: Positive for agitation, dysphoric mood and sleep disturbance. The patient is nervous/anxious.     Social History   Tobacco Use   Smoking status: Current Every Day Smoker    Types: E-cigarettes   Smokeless tobacco: Never Used  Substance Use Topics   Alcohol use: No    Alcohol/week: 0.0 standard drinks      Objective:   BP 138/85 (BP Location: Left Arm, Patient Position: Sitting, Cuff Size: Normal)    Pulse 96    Temp (!) 97.3 F (36.3 C) (Temporal)    Wt 191 lb (86.6 kg)    BMI 30.83 kg/m  Vitals:   06/11/19 1132 06/11/19 1146  BP: (!) 142/94 138/85  Pulse: 89 96  Temp: (!) 97.3 F (36.3 C)   TempSrc: Temporal   Weight: 191 lb (86.6 kg)   Body mass index is 30.83 kg/m.   Physical Exam Vitals signs reviewed.  Constitutional:      General: She is not in acute distress.    Appearance: Normal appearance. She is well-developed. She is obese. She is not ill-appearing.  HENT:     Head: Normocephalic and atraumatic.  Neck:     Musculoskeletal: Normal range of motion and neck supple.  Pulmonary:     Effort: Pulmonary effort is normal. No respiratory distress.  Musculoskeletal:     Left knee: She exhibits swelling. She exhibits normal range of motion, no  effusion, no erythema, normal alignment, no LCL laxity, normal patellar mobility, no bony tenderness, normal meniscus and no MCL laxity. Tenderness found. Medial joint line tenderness noted. No lateral joint line, no MCL, no LCL and no patellar tendon tenderness noted.  Neurological:     Mental Status: She is alert.  Psychiatric:        Attention and Perception: Attention and perception normal.        Mood and Affect: Mood is depressed.        Speech: Speech normal.        Behavior: Behavior normal. Behavior is cooperative.        Thought Content:  Thought content normal.        Cognition and Memory: Cognition normal.        Judgment: Judgment normal.      No results found for any visits on 06/11/19.     Assessment & Plan    1. Chronic migraine without aura without status migrainosus, not intractable Has used Treximet in the past successfully. Will restart this as below. Call if having more than 4 headaches per month. - SUMAtriptan-naproxen (TREXIMET) 85-500 MG tablet; Take 1 tablet by mouth every 2 (two) hours as needed for migraine.  Dispense: 10 tablet; Refill: 11  2. GAD (generalized anxiety disorder) Previously been on sertraline and alprazolam. Had hot flashes with sertraline. Will change therapy to Escitalopram as below. Alprazolam restarted. She is to call if not improving or tolerating medications. Send mychart message if doing well.  - ALPRAZolam (XANAX) 0.5 MG tablet; Take 1 tablet (0.5 mg total) by mouth at bedtime as needed for anxiety.  Dispense: 30 tablet; Refill: 5 - escitalopram (LEXAPRO) 10 MG tablet; Take 1 tablet (10 mg total) by mouth at bedtime. Start with 1/2 tab PO q hs x 1 week then increase to 1 tab PO q hs  Dispense: 90 tablet; Refill: 1  3. Depression, recurrent ([place]) See above medical treatment plan. - ALPRAZolam (XANAX) 0.5 MG tablet; Take 1 tablet (0.5 mg total) by mouth at bedtime as needed for anxiety.  Dispense: 30 tablet; Refill: 5 - escitalopram (LEXAPRO) 10 MG tablet; Take 1 tablet (10 mg total) by mouth at bedtime. Start with 1/2 tab PO q hs x 1 week then increase to 1 tab PO q hs  Dispense: 90 tablet; Refill: 1  4. Chronic pain of left knee Advised to try voltaren gel OTC first. If not helping knee pain, she can start Meloxicam. If not improving please call the office.  - meloxicam (MOBIC) 7.5 MG tablet; Take 1 tablet (7.5 mg total) by mouth daily.  Dispense: 90 tablet; Refill: [place], PA-C  [facility]

## 2019-06-11 NOTE — Patient Instructions (Signed)
Voltaren gel (use first then if not working/improving after 3-4 hours then take meloxicam) Knee compression sleeve or brace [email or web address] for emotional support letter

## 2019-06-13 ENCOUNTER — Telehealth: Payer: Self-pay

## 2019-06-13 DIAGNOSIS — G43709 Chronic migraine without aura, not intractable, without status migrainosus: Secondary | ICD-10-CM

## 2019-06-13 MED ORDER — SUMATRIPTAN SUCCINATE 100 MG PO TABS: 100.0000 mg | ORAL_TABLET | ORAL | 0 refills | Status: DC | PRN

## 2019-06-13 NOTE — Telephone Encounter (Signed)
The PA for Treximet was denied. Patient insurance will cover generic Treximet after patient have tried naproxen and Imitrex used in combination AND one additional preferred triptan medication such as Maxalt, Amerge and Zomig in generic.

## 2019-06-13 NOTE — Telephone Encounter (Signed)
Patient advised.KW 

## 2019-06-13 NOTE — Telephone Encounter (Signed)
Patient has been advised. KW 

## 2019-06-13 NOTE — Telephone Encounter (Signed)
D/C treximet and sent in sumatriptan only. She can take Naproxen OTC prn with if needed

## 2019-06-22 DIAGNOSIS — L853 Xerosis cutis: Secondary | ICD-10-CM | POA: Diagnosis not present

## 2019-06-22 DIAGNOSIS — L219 Seborrheic dermatitis, unspecified: Secondary | ICD-10-CM | POA: Diagnosis not present

## 2019-06-22 DIAGNOSIS — L209 Atopic dermatitis, unspecified: Secondary | ICD-10-CM | POA: Diagnosis not present

## 2019-07-11 NOTE — Progress Notes (Signed)
Patient: Lisa Alvarado Female    DOB: [birth year]   58 y.o.   MRN: [id number] Visit Date: 07/16/2019  Today's Provider: Margaretann Loveless, PA-C   No chief complaint on file.  Subjective:    I,Lisa Alvarado,RMA am acting as a Neurosurgeon for Margaretann Loveless, PA-C.  HPI  Anxiety/Depression: Patient reports feeling better. Reports that she still has some symptoms but she can see the difference. She is not having side effect. Reports that she actually got up to clean the house. She complains of depressed mood and frustration.  Depression screen [facility] 2/9 07/16/2019 06/11/2019 06/07/2019  Decreased Interest 2 3 3   Down, Depressed, Hopeless 1 1 2   PHQ - 2 Score 3 4 5   Altered sleeping 3 3 3   Tired, decreased energy 1 3 3   Change in appetite 2 3 2   Feeling bad or failure about yourself  2 1 1   Trouble concentrating 2 3 1   Moving slowly or fidgety/restless 1 0 0  Suicidal thoughts 0 0 0  PHQ-9 Score 14 17 15   Difficult doing work/chores Somewhat difficult Somewhat difficult Somewhat difficult   GAD 7 : Generalized Anxiety Score 07/16/2019 06/11/2019  Nervous, Anxious, on Edge 2 1  Control/stop worrying 2 3  Worry too much - different things 1 3  Trouble relaxing 1 2  Restless 1 0  Easily annoyed or irritable 3 3  Afraid - awful might happen 1 0  Total GAD 7 Score 11 12  Anxiety Difficulty Somewhat difficult Somewhat difficult      Allergies  Allergen Reactions  . Sulfa Antibiotics   . Sulfur Other (See Comments)  . Sulfamethoxazole-Trimethoprim Rash     Current Outpatient Medications:  .  ALPRAZolam (XANAX) 0.5 MG tablet, Take 1 tablet (0.5 mg total) by mouth at bedtime as needed for anxiety., Disp: 30 tablet, Rfl: 5 .  atorvastatin (LIPITOR) 20 MG tablet, Take 1 tablet (20 mg total) by mouth daily., Disp: 90 tablet, Rfl: 3 .  Boswellia-Glucosamine-Vit D (OSTEO BI-FLEX ONE PER DAY PO), Take by mouth., Disp: , Rfl:  .  escitalopram (LEXAPRO) 10 MG tablet,  Take 1 tablet (10 mg total) by mouth at bedtime. Start with 1/2 tab PO q hs x 1 week then increase to 1 tab PO q hs, Disp: 90 tablet, Rfl: 1 .  Fluocinolone Acetonide 0.01 % OIL, SMARTSIG:1-2 Drop(s) In Ear(s) 1 to 2 Times Daily, Disp: , Rfl:  .  hydrOXYzine (ATARAX/VISTARIL) 10 MG tablet, Take 1 tablet (10 mg total) by mouth 3 (three) times daily as needed for itching., Disp: 90 tablet, Rfl: 0 .  levonorgestrel (MIRENA) 20 MCG/24HR IUD, by Intrauterine route., Disp: , Rfl:  .  meloxicam (MOBIC) 7.5 MG tablet, Take 1 tablet (7.5 mg total) by mouth daily., Disp: 90 tablet, Rfl: 1 .  SUMAtriptan (IMITREX) 100 MG tablet, Take 1 tablet (100 mg total) by mouth every 2 (two) hours as needed for migraine. No more than 200mg  in 24 hr period, Disp: 10 tablet, Rfl: 0 .  triamcinolone cream (KENALOG) 0.1 %, Apply to aa's arms BID PRN. Avoid f/g/a., Disp: , Rfl:   Review of Systems  Constitutional: Negative for appetite change, chills, fatigue and fever.  Respiratory: Negative for chest tightness and shortness of breath.   Cardiovascular: Negative for chest pain and palpitations.  Gastrointestinal: Negative for abdominal pain, nausea and vomiting.  Neurological: Negative for dizziness and weakness.  Psychiatric/Behavioral: Positive for agitation and dysphoric mood. Negative  for sleep disturbance. The patient is nervous/anxious.     Social History   Tobacco Use  . Smoking status: Current Every Day Smoker    Types: E-cigarettes  . Smokeless tobacco: Never Used  Substance Use Topics  . Alcohol use: No    Alcohol/week: 0.0 standard drinks      Objective:   BP 137/84 (BP Location: Left Arm, Patient Position: Sitting, Cuff Size: Large)   Pulse 72   Temp 97.6 F (36.4 C) (Temporal)   Resp 16   Wt 189 lb 3.2 oz (85.8 kg)   BMI 30.54 kg/m  Vitals:   07/16/19 0824  BP: 137/84  Pulse: 72  Resp: 16  Temp: 97.6 F (36.4 C)  TempSrc: Temporal  Weight: 189 lb 3.2 oz (85.8 kg)  Body mass index is  30.54 kg/m.   Physical Exam Vitals signs reviewed.  Constitutional:      General: She is not in acute distress.    Appearance: Normal appearance. She is well-developed. She is not ill-appearing.  HENT:     Head: Normocephalic and atraumatic.  Neck:     Musculoskeletal: Normal range of motion and neck supple.  Pulmonary:     Effort: Pulmonary effort is normal. No respiratory distress.  Neurological:     Mental Status: She is alert.  Psychiatric:        Mood and Affect: Mood normal.        Behavior: Behavior normal.        Thought Content: Thought content normal.        Judgment: Judgment normal.      No results found for any visits on 07/16/19.     Assessment & Plan    1. GAD (generalized anxiety disorder) Symptoms are improving, but still not to where she would like to be. Will increase dose from 10mg  to 20mg  as below. She is to send a mychart message in f/u in the next 3 weeks to let me know how this dose is working for her.  - escitalopram (LEXAPRO) 20 MG tablet; Take 1 tablet (20 mg total) by mouth daily.  Dispense: 90 tablet; Refill: 1  2. Depression, recurrent ([place]) See above medical treatment plan. - escitalopram (LEXAPRO) 20 MG tablet; Take 1 tablet (20 mg total) by mouth daily.  Dispense: 90 tablet; Refill: [place], PA-C  [facility]

## 2019-07-16 ENCOUNTER — Encounter: Payer: Self-pay | Admitting: Physician Assistant

## 2019-07-16 ENCOUNTER — Ambulatory Visit: Payer: BC Managed Care – PPO | Admitting: Physician Assistant

## 2019-07-16 ENCOUNTER — Other Ambulatory Visit: Payer: Self-pay

## 2019-07-16 VITALS — BP 137/84 | HR 72 | Temp 97.6°F | Resp 16 | Wt 189.2 lb

## 2019-07-16 DIAGNOSIS — F411 Generalized anxiety disorder: Secondary | ICD-10-CM | POA: Diagnosis not present

## 2019-07-16 DIAGNOSIS — F339 Major depressive disorder, recurrent, unspecified: Secondary | ICD-10-CM

## 2019-07-16 MED ORDER — ESCITALOPRAM OXALATE 20 MG PO TABS: 20.0000 mg | ORAL_TABLET | Freq: Every day | ORAL | 1 refills | Status: DC

## 2019-07-16 NOTE — Patient Instructions (Signed)
10 Relaxation Techniques That Zap Stress Fast By Jeannette Moninger   Listen  Relax. You deserve it, it's good for you, and it takes less time than you think. You don't need a spa weekend or a retreat. Each of these stress-relieving tips can get you from OMG to om in less than 15 minutes. 1. Meditate  A few minutes of practice per day can help ease anxiety. "Research suggests that daily meditation may alter the brain's neural pathways, making you more resilient to stress," says psychologist Robbie Maller Hartman, PhD, a [place] health and wellness coach. It's simple. Sit up straight with both feet on the floor. Close your eyes. Focus your attention on reciting -- out loud or silently -- a positive mantra such as "I feel at peace" or "I love myself." Place one hand on your belly to sync the mantra with your breaths. Let any distracting thoughts float by like clouds. 2. Breathe Deeply  Take a 5-minute break and focus on your breathing. Sit up straight, eyes closed, with a hand on your belly. Slowly inhale through your nose, feeling the breath start in your abdomen and work its way to the top of your head. Reverse the process as you exhale through your mouth.  "Deep breathing counters the effects of stress by slowing the heart rate and lowering blood pressure," psychologist Judith Tutin, PhD, says. She's a certified life coach in [place], [place] 3. Be Present  Slow down.  "Take 5 minutes and focus on only one behavior with awareness," Tutin says. Notice how the air feels on your face when you're walking and how your feet feel hitting the ground. Enjoy the texture and taste of each bite of food. When you spend time in the moment and focus on your senses, you should feel less tense. 4. Reach Out  Your social network is one of your best tools for handling stress. Talk to others -- preferably face to face, or at least on the phone. Share what's going on. You can get a fresh perspective while keeping your  connection strong. 5. Tune In to Your Body  Mentally scan your body to get a sense of how stress affects it each day. Lie on your back, or sit with your feet on the floor. Start at your toes and work your way up to your scalp, noticing how your body feels.  "Simply be aware of places you feel tight or loose without trying to change anything," Tutin says. For 1 to 2 minutes, imagine each deep breath flowing to that body part. Repeat this process as you move your focus up your body, paying close attention to sensations you feel in each body part. 6. Decompress  Place a warm heat wrap around your neck and shoulders for 10 minutes. Close your eyes and relax your face, neck, upper chest, and back muscles. Remove the wrap, and use a tennis ball or foam roller to massage away tension.  "Place the ball between your back and the wall. Lean into the ball, and hold gentle pressure for up to 15 seconds. Then move the ball to another spot, and apply pressure," says Cathy Benninger, a nurse practitioner and assistant professor at [employer] [facility] in [place]. 7. Laugh Out Loud  A good belly laugh doesn't just lighten the load mentally. It lowers cortisol, your body's stress hormone, and boosts brain chemicals called endorphins, which help your mood. Lighten up by tuning in to your favorite sitcom or video, reading   the comics, or chatting with someone who makes you smile. 8. Crank Up the Tunes  Research shows that listening to soothing music can lower blood pressure, heart rate, and anxiety. "Create a playlist of songs or nature sounds (the ocean, a bubbling brook, birds chirping), and allow your mind to focus on the different melodies, instruments, or singers in the piece," Benninger says. You also can blow off steam by rocking out to more upbeat tunes -- or singing at the top of your lungs! 9. Get Moving  You don't have to run in order to get a runner's high. All forms of exercise,  including yoga and walking, can ease depression and anxiety by helping the brain release feel-good chemicals and by giving your body a chance to practice dealing with stress. You can go for a quick walk around the block, take the stairs up and down a few flights, or do some stretching exercises like head rolls and shoulder shrugs. 10. Be Grateful  Keep a gratitude journal or several (one by your bed, one in your purse, and one at work) to help you remember all the things that are good in your life.  "Being grateful for your blessings cancels out negative thoughts and worries," says Joni Emmerling, a wellness coach in [place], [place].  Use these journals to savor good experiences like a child's smile, a sunshine-filled day, and good health. Don't forget to celebrate accomplishments like mastering a new task at work or a new hobby. When you start feeling stressed, spend a few minutes looking through your notes to remind yourself what really matters.   

## 2019-07-23 DIAGNOSIS — L300 Nummular dermatitis: Secondary | ICD-10-CM | POA: Diagnosis not present

## 2019-07-23 DIAGNOSIS — L219 Seborrheic dermatitis, unspecified: Secondary | ICD-10-CM | POA: Diagnosis not present

## 2019-09-21 ENCOUNTER — Ambulatory Visit (INDEPENDENT_AMBULATORY_CARE_PROVIDER_SITE_OTHER): Payer: BC Managed Care – PPO | Admitting: Physician Assistant

## 2019-09-21 ENCOUNTER — Other Ambulatory Visit: Payer: Self-pay

## 2019-09-21 ENCOUNTER — Encounter: Payer: Self-pay | Admitting: Physician Assistant

## 2019-09-21 VITALS — BP 137/83 | HR 79 | Temp 96.9°F | Resp 16 | Wt 188.0 lb

## 2019-09-21 DIAGNOSIS — G43709 Chronic migraine without aura, not intractable, without status migrainosus: Secondary | ICD-10-CM | POA: Diagnosis not present

## 2019-09-21 DIAGNOSIS — G8929 Other chronic pain: Secondary | ICD-10-CM | POA: Diagnosis not present

## 2019-09-21 DIAGNOSIS — M25562 Pain in left knee: Secondary | ICD-10-CM

## 2019-09-21 DIAGNOSIS — L299 Pruritus, unspecified: Secondary | ICD-10-CM | POA: Diagnosis not present

## 2019-09-21 MED ORDER — SUMATRIPTAN SUCCINATE 100 MG PO TABS: 100.0000 mg | ORAL_TABLET | ORAL | 5 refills | Status: DC | PRN

## 2019-09-21 MED ORDER — HYDROXYZINE HCL 10 MG PO TABS: 10.0000 mg | ORAL_TABLET | Freq: Three times a day (TID) | ORAL | 3 refills | Status: DC | PRN

## 2019-09-21 NOTE — Patient Instructions (Signed)
Medial Collateral Knee Ligament Sprain  The medial collateral ligament (MCL) is a tough band of tissue in the knee that connects the thigh bone to the shin bone. Your MCL prevents your knee from moving too far inward and helps to keep your knee stable. An MCL sprain is a stretch or tear in the MCL. What are the causes? This condition may be caused by:  A hard, direct hit (trauma) to the inside of your knee. This is a common cause.  Your knee falling inward when you run, change directions quickly (cut), jump, or pivot.  Repeatedly overstretching the MCL. What increases the risk? The following factors make you more likely to develop this condition:  Playing contact sports, such as wrestling or football.  Participating in sports that involve sudden movements of cutting, twisting, or turning. These movements are common in hockey, skiing, and soccer.  Having weak hip and core muscles. What are the signs or symptoms? Symptoms of this condition include:  Feeling or hearing a popping at the time of injury.  Pain on the inside of the knee.  Swelling in the knee.  Bruising around the knee.  Tenderness when pressing the inside of the knee.  Feeling unstable when you stand, like your knee will give way.  Difficulty walking on uneven surfaces. How is this diagnosed? This condition may be diagnosed based on:  Your medical history.  A physical exam.  Imaging tests, such as an X-ray, ultrasound, or MRI. During your physical exam, your health care provider will check for pain, limited motion, and instability. How is this treated? Treatment for this condition depends on how severe the injury is. Treatment may include:  Keeping weight off the knee until swelling and pain improve.  Raising (elevating) the knee above the level of your heart. This helps to reduce swelling.  Icing the knee. This helps to reduce swelling.  Taking an NSAID, such as ibuprofen. This helps to reduce pain and  swelling.  Using a knee brace, elastic sleeve, or crutches while the injury heals.  Using a knee brace when participating in athletic activities.  Doing rehab exercises (physical therapy).  Surgery. This may be needed if: ? Your MCL tore all the way through. ? Your knee is unstable. ? Your knee is not getting better with other treatments. Follow these instructions at home: If you have a brace or sleeve:  Wear it as told by your health care provider. Remove it only as told by your health care provider.  Loosen the brace or remove the sleeve if your toes tingle, become numb, or turn cold and blue.  Keep the brace or sleeve clean.  If the brace or sleeve is not waterproof: ? Do not let it get wet. ? Cover it with a watertight covering when you take a bath or shower. Managing pain, stiffness, and swelling   If directed, put ice on the inside area of your knee. ? If you have a removable brace or sleeve, remove it as told by your health care provider. ? Put ice in a plastic bag. ? Place a towel between your skin and the bag. ? Leave the ice on for 20 minutes, 2-3 times a day.  Move your foot and toes often to reduce stiffness and swelling.  Elevate the injured area above the level of your heart while you are sitting or lying down. Activity  Ask your health care provider when it is safe to drive if you have a brace or sleeve   on your leg.  Return to your normal activities as told by your health care provider. Ask your health care provider what activities are safe for you.  Do exercises as told by your health care provider.  Do not use the injured leg to support your body weight until your health care provider says that you can. Use crutches as told by your health care provider. General instructions  Take over-the-counter and prescription medicines only as told by your health care provider.  Do not use any products that contain nicotine or tobacco, such as cigarettes,  e-cigarettes, and chewing tobacco. These can delay healing. If you need help quitting, ask your health care provider.  Keep all follow-up visits as told by your health care provider. This is important. How is this prevented?  Warm up and stretch before being active.  Cool down and stretch after being active.  Give your body time to rest between periods of activity.  Make sure to use equipment that fits you.  Be safe and responsible while being active. This will help you avoid falls.  Do at least 150 minutes of moderate-intensity exercise each week, such as brisk walking or water aerobics.  Maintain physical fitness, including: ? Strength. ? Flexibility. ? Cardiovascular fitness. ? Endurance. Contact a health care provider if:  Your symptoms do not improve.  Your symptoms get worse. Summary  An MCL sprain is a knee injury that is caused by stretching the MCL too far. The injury can involve a tear in the MCL.  Treatment for this condition depends on how severe the injury is. It may include rest, wearing a brace, or surgery.  Do not use the injured leg to support your body weight until your health care provider says that you can. Use crutches as told by your health care provider.  Contact a health care provider if your symptoms do not get better or they get worse.  Keep all follow-up visits as told by your health care provider. This is important. This information is not intended to replace advice given to you by your health care provider. Make sure you discuss any questions you have with your health care provider. Document Revised: 03/22/2018 Document Reviewed: 03/22/2018 Elsevier Patient Education  2020 [employer]  

## 2019-09-21 NOTE — Progress Notes (Signed)
Patient: Lisa Alvarado Female    DOB: [birth year]   59 y.o.   MRN: [id number] Visit Date: 09/21/2019  Today's Provider: Margaretann Loveless, PA-C   Chief Complaint  Patient presents with  . Knee Pain   Subjective:     Knee Pain  The incident occurred more than 1 week ago (Last Friday). There was no injury mechanism. The pain is present in the left knee. The quality of the pain is described as stabbing and aching. Pain scale: is not as bad as when it started now is just painful while walking. The pain has been fluctuating (It was constant last Friday with a lot of swelling) since onset. Pertinent negatives include no inability to bear weight, numbness or tingling. Associated symptoms comments: Last Friday she was not able to walk. She reports no foreign bodies present. The symptoms are aggravated by movement. She has tried elevation, heat, ice and rest (Meloxicam) for the symptoms. The treatment provided mild relief.    Allergies  Allergen Reactions  . Sulfa Antibiotics   . Sulfur Other (See Comments)  . Sulfamethoxazole-Trimethoprim Rash     Current Outpatient Medications:  .  ALPRAZolam (XANAX) 0.5 MG tablet, Take 1 tablet (0.5 mg total) by mouth at bedtime as needed for anxiety., Disp: 30 tablet, Rfl: 5 .  atorvastatin (LIPITOR) 20 MG tablet, Take 1 tablet (20 mg total) by mouth daily., Disp: 90 tablet, Rfl: 3 .  Boswellia-Glucosamine-Vit D (OSTEO BI-FLEX ONE PER DAY PO), Take by mouth., Disp: , Rfl:  .  escitalopram (LEXAPRO) 20 MG tablet, Take 1 tablet (20 mg total) by mouth daily., Disp: 90 tablet, Rfl: 1 .  Fluocinolone Acetonide 0.01 % OIL, SMARTSIG:1-2 Drop(s) In Ear(s) 1 to 2 Times Daily, Disp: , Rfl:  .  hydrOXYzine (ATARAX/VISTARIL) 10 MG tablet, Take 1 tablet (10 mg total) by mouth 3 (three) times daily as needed for itching., Disp: 90 tablet, Rfl: 0 .  levonorgestrel (MIRENA) 20 MCG/24HR IUD, by Intrauterine route., Disp: , Rfl:  .  meloxicam (MOBIC) 7.5 MG  tablet, Take 1 tablet (7.5 mg total) by mouth daily., Disp: 90 tablet, Rfl: 1 .  SUMAtriptan (IMITREX) 100 MG tablet, Take 1 tablet (100 mg total) by mouth every 2 (two) hours as needed for migraine. No more than 200mg  in 24 hr period, Disp: 10 tablet, Rfl: 0 .  triamcinolone cream (KENALOG) 0.1 %, Apply to aa's arms BID PRN. Avoid f/g/a., Disp: , Rfl:   Review of Systems  Constitutional: Negative.   Respiratory: Negative.   Cardiovascular: Negative.   Musculoskeletal: Positive for arthralgias, gait problem and joint swelling.  Neurological: Negative for tingling, weakness and numbness.    Social History   Tobacco Use  . Smoking status: Current Every Day Smoker    Types: E-cigarettes  . Smokeless tobacco: Never Used  Substance Use Topics  . Alcohol use: No    Alcohol/week: 0.0 standard drinks      Objective:   BP 137/83 (BP Location: Left Arm, Patient Position: Sitting, Cuff Size: Large)   Pulse 79   Temp (!) 96.9 F (36.1 C) (Temporal)   Resp 16   Wt 188 lb (85.3 kg)   BMI 30.34 kg/m  Vitals:   09/21/19 1039  BP: 137/83  Pulse: 79  Resp: 16  Temp: (!) 96.9 F (36.1 C)  TempSrc: Temporal  Weight: 188 lb (85.3 kg)  Body mass index is 30.34 kg/m.   Physical Exam Vitals reviewed.  Constitutional:  General: She is not in acute distress.    Appearance: Normal appearance. She is well-developed.  HENT:     Head: Normocephalic and atraumatic.  Pulmonary:     Effort: Pulmonary effort is normal. No respiratory distress.  Musculoskeletal:     Cervical back: Normal range of motion and neck supple.     Right knee: Normal.     Left knee: Swelling present. No deformity, effusion, erythema, ecchymosis, bony tenderness or crepitus. Normal range of motion. Tenderness present over the MCL. MCL laxity present. No LCL laxity, ACL laxity or PCL laxity.Normal alignment, normal meniscus and normal patellar mobility. Normal pulse.     Instability Tests: Negative medial McMurray  test and negative lateral McMurray test.  Neurological:     Mental Status: She is alert.  Psychiatric:        Mood and Affect: Mood normal.        Behavior: Behavior normal.        Thought Content: Thought content normal.        Judgment: Judgment normal.      No results found for any visits on 09/21/19.     Assessment & Plan    1. Chronic pain of left knee Acute on chronic issue. Suspect MCL sprain and possibly some pes anserine bursitis as she was tender just over this area as well. Since symptoms have been improving over the last week will continue conservative management. Continue Meloxicam prn. May apply heat to the area. Discussed getting a knee brace for support and elevating the leg when at rest. Call if worsening or not improving.   2. Chronic migraine without aura without status migrainosus, not intractable Stable. Diagnosis pulled for medication refill. Continue current medical treatment plan. - SUMAtriptan (IMITREX) 100 MG tablet; Take 1 tablet (100 mg total) by mouth every 2 (two) hours as needed for migraine. No more than 200mg  in 24 hr period  Dispense: 10 tablet; Refill: 5  3. Itching Stable. Diagnosis pulled for medication refill. Continue current medical treatment plan. - hydrOXYzine (ATARAX/VISTARIL) 10 MG tablet; Take 1 tablet (10 mg total) by mouth 3 (three) times daily as needed for itching.  Dispense: 90 tablet; Refill: Chauncey, PA-C  [facility]

## 2019-10-11 ENCOUNTER — Ambulatory Visit
Admission: RE | Admit: 2019-10-11 | Discharge: 2019-10-11 | Disposition: A | Discharge status: Home / Self Care | Payer: BC Managed Care – PPO | Source: Ambulatory Visit | Attending: Physician Assistant | Admitting: Physician Assistant

## 2019-10-11 ENCOUNTER — Other Ambulatory Visit: Payer: Self-pay

## 2019-10-11 ENCOUNTER — Other Ambulatory Visit: Payer: Self-pay | Admitting: Physician Assistant

## 2019-10-11 DIAGNOSIS — N6489 Other specified disorders of breast: Secondary | ICD-10-CM

## 2019-10-11 DIAGNOSIS — N6310 Unspecified lump in the right breast, unspecified quadrant: Secondary | ICD-10-CM

## 2019-10-11 DIAGNOSIS — Z1231 Encounter for screening mammogram for malignant neoplasm of breast: Secondary | ICD-10-CM | POA: Diagnosis not present

## 2019-10-11 DIAGNOSIS — R928 Other abnormal and inconclusive findings on diagnostic imaging of breast: Secondary | ICD-10-CM

## 2019-10-16 ENCOUNTER — Telehealth: Payer: Self-pay

## 2019-10-16 NOTE — Telephone Encounter (Signed)
Copied from CRM [id number]. Topic: General - Other >> Oct 16, 2019 10:49 AM Gwenlyn Fudge wrote: Reason for CRM: Pt called and is requesting to speak with nurse regarding her mammogram results. Please advise.

## 2019-10-17 NOTE — Telephone Encounter (Signed)
It looks like there was a possible mass on the right breast. This does not necessarily mean cancer at this stage but it does need follow up with additional mammogram and ultrasound. If the follow up images continue to show the mass, the breast specialists would likely recommend a biopsy. This will all be coordinated by the breast center. You can relay this message to the patient.

## 2019-10-17 NOTE — Telephone Encounter (Signed)
Patient was advised and states that she will reach out to the breast center due to them not calling to reschedule appointment for another mammogram.FYI

## 2019-10-19 ENCOUNTER — Other Ambulatory Visit: Payer: Self-pay

## 2019-10-19 ENCOUNTER — Ambulatory Visit
Admission: RE | Admit: 2019-10-19 | Discharge: 2019-10-19 | Disposition: A | Discharge status: Home / Self Care | Payer: BC Managed Care – PPO | Source: Ambulatory Visit | Attending: Physician Assistant | Admitting: Physician Assistant

## 2019-10-19 DIAGNOSIS — R928 Other abnormal and inconclusive findings on diagnostic imaging of breast: Secondary | ICD-10-CM

## 2019-10-19 DIAGNOSIS — R922 Inconclusive mammogram: Secondary | ICD-10-CM | POA: Diagnosis not present

## 2019-10-19 DIAGNOSIS — N6489 Other specified disorders of breast: Secondary | ICD-10-CM | POA: Insufficient documentation

## 2019-10-19 DIAGNOSIS — N6310 Unspecified lump in the right breast, unspecified quadrant: Secondary | ICD-10-CM

## 2019-10-19 DIAGNOSIS — N6011 Diffuse cystic mastopathy of right breast: Secondary | ICD-10-CM | POA: Diagnosis not present

## 2019-10-26 ENCOUNTER — Other Ambulatory Visit: Payer: BC Managed Care – PPO

## 2019-10-26 ENCOUNTER — Ambulatory Visit: Payer: BC Managed Care – PPO

## 2019-11-17 ENCOUNTER — Ambulatory Visit: Payer: BC Managed Care – PPO | Attending: Internal Medicine

## 2019-11-17 DIAGNOSIS — Z23 Encounter for immunization: Secondary | ICD-10-CM

## 2019-11-17 NOTE — Progress Notes (Signed)
   Covid-19 Vaccination Clinic  Name:  Lisa Alvarado    MRN: [id number] DOB: 10-08-1960  11/17/2019  [name] was observed post Covid-19 immunization for 15 minutes without incident. She was provided with Vaccine Information Sheet and instruction to access the V-Safe system.   [name] was instructed to call 911 with any severe reactions post vaccine: Marland Kitchen Difficulty breathing  . Swelling of face and throat  . A fast heartbeat  . A bad rash all over body  . Dizziness and weakness   Immunizations Administered    Name Date Dose VIS Date Route   Pfizer COVID-19 Vaccine 11/17/2019  9:04 AM 0.3 mL 07/27/2019 Intramuscular   Manufacturer: [employer], [employer]   Lot: [id number]   NDC: [address]-8

## 2019-12-02 ENCOUNTER — Other Ambulatory Visit: Payer: Self-pay | Admitting: Physician Assistant

## 2019-12-02 DIAGNOSIS — M25562 Pain in left knee: Secondary | ICD-10-CM

## 2019-12-02 DIAGNOSIS — G8929 Other chronic pain: Secondary | ICD-10-CM

## 2019-12-02 NOTE — Telephone Encounter (Signed)
Requested Prescriptions  Pending Prescriptions Disp Refills  . meloxicam (MOBIC) 7.5 MG tablet [Pharmacy Med Name: MELOXICAM 7.5MG  TABLETS] 90 tablet 1    Sig: TAKE 1 TABLET(7.5 MG) BY MOUTH DAILY     Analgesics:  COX2 Inhibitors Passed - 12/02/2019 12:14 PM      Passed - HGB in normal range and within 360 days    Hemoglobin  Date Value Ref Range Status  06/07/2019 15.0 11.1 - 15.9 g/dL Final         Passed - Cr in normal range and within 360 days    Creatinine, Ser  Date Value Ref Range Status  06/07/2019 0.83 0.57 - 1.00 mg/dL Final         Passed - Patient is not pregnant      Passed - Valid encounter within last 12 months    Recent Outpatient Visits          2 months ago Chronic pain of left knee   [facility] [place], [place], PA-C   4 months ago GAD (generalized anxiety disorder)   [facility] [place], [place], [place]   5 months ago Chronic migraine without aura without status migrainosus, not intractable   [facility], [place], PA-C   5 months ago Encounter for annual physical exam   [facility] [place], [place], [place]   7 months ago Itching   [facility] [place], [place], [place]

## 2019-12-10 ENCOUNTER — Ambulatory Visit: Payer: BC Managed Care – PPO | Attending: Internal Medicine

## 2019-12-10 DIAGNOSIS — Z23 Encounter for immunization: Secondary | ICD-10-CM

## 2019-12-10 NOTE — Progress Notes (Signed)
   Covid-19 Vaccination Clinic  Name:  Lisa Alvarado    MRN: [id number] DOB: [birth year]  12/10/2019  Ms. Boot was observed post Covid-19 immunization for 15 minutes without incident. She was provided with Vaccine Information Sheet and instruction to access the V-Safe system.   [name] was instructed to call 911 with any severe reactions post vaccine: Marland Kitchen Difficulty breathing  . Swelling of face and throat  . A fast heartbeat  . A bad rash all over body  . Dizziness and weakness   Immunizations Administered    Name Date Dose VIS Date Route   Pfizer COVID-19 Vaccine 12/10/2019 10:10 AM 0.3 mL 10/10/2018 Intramuscular   Manufacturer: [employer], [employer]   Lot: [id number]   NDC: [address]-1

## 2019-12-11 ENCOUNTER — Ambulatory Visit: Payer: Self-pay

## 2020-01-11 ENCOUNTER — Other Ambulatory Visit: Payer: Self-pay | Admitting: Physician Assistant

## 2020-01-11 DIAGNOSIS — F339 Major depressive disorder, recurrent, unspecified: Secondary | ICD-10-CM

## 2020-01-11 DIAGNOSIS — F411 Generalized anxiety disorder: Secondary | ICD-10-CM

## 2020-01-11 NOTE — Telephone Encounter (Signed)
Call to patient to schedule medication follow up appointment- courtesy RF given #[phone]

## 2020-02-18 ENCOUNTER — Other Ambulatory Visit: Payer: Self-pay | Admitting: Physician Assistant

## 2020-02-18 DIAGNOSIS — F411 Generalized anxiety disorder: Secondary | ICD-10-CM

## 2020-02-18 DIAGNOSIS — F339 Major depressive disorder, recurrent, unspecified: Secondary | ICD-10-CM

## 2020-03-03 ENCOUNTER — Encounter: Payer: Self-pay | Admitting: Physician Assistant

## 2020-03-03 ENCOUNTER — Ambulatory Visit: Payer: BC Managed Care – PPO | Admitting: Physician Assistant

## 2020-03-03 ENCOUNTER — Other Ambulatory Visit: Payer: Self-pay

## 2020-03-03 VITALS — BP 131/82 | HR 79 | Temp 98.6°F | Wt 190.0 lb

## 2020-03-03 DIAGNOSIS — F411 Generalized anxiety disorder: Secondary | ICD-10-CM | POA: Diagnosis not present

## 2020-03-03 DIAGNOSIS — E7800 Pure hypercholesterolemia, unspecified: Secondary | ICD-10-CM | POA: Diagnosis not present

## 2020-03-03 DIAGNOSIS — M25562 Pain in left knee: Secondary | ICD-10-CM

## 2020-03-03 DIAGNOSIS — G8929 Other chronic pain: Secondary | ICD-10-CM

## 2020-03-03 DIAGNOSIS — E559 Vitamin D deficiency, unspecified: Secondary | ICD-10-CM

## 2020-03-03 DIAGNOSIS — R7303 Prediabetes: Secondary | ICD-10-CM | POA: Diagnosis not present

## 2020-03-03 DIAGNOSIS — E669 Obesity, unspecified: Secondary | ICD-10-CM

## 2020-03-03 DIAGNOSIS — G4700 Insomnia, unspecified: Secondary | ICD-10-CM

## 2020-03-03 DIAGNOSIS — E538 Deficiency of other specified B group vitamins: Secondary | ICD-10-CM

## 2020-03-03 DIAGNOSIS — I10 Essential (primary) hypertension: Secondary | ICD-10-CM | POA: Diagnosis not present

## 2020-03-03 DIAGNOSIS — E663 Overweight: Secondary | ICD-10-CM | POA: Insufficient documentation

## 2020-03-03 MED ORDER — TRAZODONE HCL 50 MG PO TABS: 50.0000 mg | ORAL_TABLET | Freq: Every evening | ORAL | 1 refills | Status: DC | PRN

## 2020-03-03 MED ORDER — FLUOCINOLONE ACETONIDE 0.01 % OT OIL: TOPICAL_OIL | OTIC | 5 refills | Status: AC

## 2020-03-03 MED ORDER — ESCITALOPRAM OXALATE 10 MG PO TABS
10.0000 mg | ORAL_TABLET | Freq: Every day | ORAL | 1 refills | Status: DC
Start: 2020-03-03 — End: 2020-04-07

## 2020-03-03 NOTE — Assessment & Plan Note (Signed)
Has been low in the past, will recheck today.

## 2020-03-03 NOTE — Assessment & Plan Note (Addendum)
Still having knee pain after a twisting injury earlier this year.  Discuss using Voltaren gel or lidocaine gel as needed.   Discussed referral to orthopedics to evaluate and treat, pt declines at this time. She will call back if she changes her mind.

## 2020-03-03 NOTE — Assessment & Plan Note (Signed)
Chronic and uncontrolled.  Pt states her therapy dog is a big help with depression anxiety symptoms. Insomnia maybe causing the worsening symptoms.   Will treat insomnia with trazodone and decrease Lexapro 10mg  a day. Follow up in about four weeks.

## 2020-03-03 NOTE — Assessment & Plan Note (Signed)
Discussed importance of healthy weight management Discussed diet and exercise  

## 2020-03-03 NOTE — Assessment & Plan Note (Signed)
Chronic and uncontrolled.  Will try Trazodone 50mg  -100mg  at bedtime.

## 2020-03-03 NOTE — Assessment & Plan Note (Signed)
Will recheck labs today .

## 2020-03-03 NOTE — Progress Notes (Signed)
I,Lisa Alvarado,acting as a Neurosurgeon for [employer], PA-C.,have documented all relevant documentation on the behalf of Lisa Loveless, PA-C,as directed by  Lisa Loveless, PA-C while in the presence of Lisa Alvarado, Lisa Alvarado.  Established patient visit   Patient: Lisa Alvarado   DOB: 1960/08/20   59 y.o. Female  MRN: [id number] Visit Date: 03/03/2020  Today's healthcare provider: Margaretann Loveless, PA-C   Chief Complaint  Patient presents with  . Hyperlipidemia  . Hypertension  . Prediabetes   Subjective    Knee Pain  The injury mechanism was a twisting injury (Pt reports twisting her knee earlier this year.). The pain is present in the left knee. The quality of the pain is described as stabbing. The pain has been fluctuating since onset. Pertinent negatives include no inability to bear weight, loss of motion, loss of sensation, muscle weakness, numbness or tingling. The symptoms are aggravated by movement and weight bearing. She has tried immobilization for the symptoms.    Lipid/Cholesterol, Follow-up  Last lipid panel Other pertinent labs  Lab Results  Component Value Date   CHOL 213 (H) 06/07/2019   HDL 38 (L) 06/07/2019   LDLCALC 135 (H) 06/07/2019   TRIG 222 (H) 06/07/2019   CHOLHDL 5.6 (H) 06/07/2019   Lab Results  Component Value Date   ALT 33 (H) 06/07/2019   AST 29 06/07/2019   PLT 232 06/07/2019   TSH 2.110 06/07/2019     She was last seen for this 9 months ago.  Management since that visit includes starting atorvastatin 20mg  daily.  She reports excellent compliance with treatment. She is not having side effects.   Symptoms: No chest pain No chest pressure/discomfort  No dyspnea No lower extremity edema  No numbness or tingling of extremity No orthopnea  No palpitations No paroxysmal nocturnal dyspnea  No speech difficulty No syncope   Current diet: in general, a "healthy" diet   Current exercise: Some  The 10-year  ASCVD risk score DC Jr., et al., 2013) is: 11.6%  --------------------------------------------------------------------------------------------------- Anxiety, Follow-up  She was last seen for anxiety 7 months ago. Changes made at last visit include increasing lexapro to 20mg  daily.   She reports excellent compliance with treatment. She reports excellent tolerance of treatment. She is not having side effects.   She feels her anxiety is mild and Improved since last visit.  Symptoms: No chest pain Yes difficulty concentrating  Yes dizziness Yes fatigue  No feelings of losing control Yes insomnia  No irritable No palpitations  Yes panic attacks Yes racing thoughts  No shortness of breath No sweating  Yes tremors/shakes    GAD-7 Results GAD-7 Generalized Anxiety Disorder Screening Tool 03/03/2020 07/16/2019 06/11/2019  1. Feeling Nervous, Anxious, or on Edge 2 2 1   2. Not Being Able to Stop or Control Worrying 1 2 3   3. Worrying Too Much About Different Things 1 1 3   4. Trouble Relaxing 1 1 2   5. Being So Restless it's Hard To Sit Still 0 1 0  6. Becoming Easily Annoyed or Irritable 1 3 3   7. Feeling Afraid As If Something Awful Might Happen 0 1 0  Total GAD-7 Score 6 11 12   Difficulty At Work, Home, or Getting  Along With Others? Somewhat difficult Somewhat difficult Somewhat difficult    PHQ-9 Scores PHQ9 SCORE ONLY 03/03/2020 07/16/2019 06/11/2019  PHQ-9 Total Score 12 14 17     ---------------------------------------------------------------------------------------------------   Patient Active  Problem List   Diagnosis Date Noted  . Obesity (BMI 30.0-34.9) 03/03/2020  . Chronic pain of left knee 06/11/2019  . Ankle sprain 01/20/2016  . Arthralgia of temporomandibular joint 01/31/2015  . Allergic rhinitis 12/19/2014  . GAD (generalized anxiety disorder) 12/19/2014  . Bronchitis, chronic (HCC) 12/19/2014  . Depression, recurrent (HCC) 12/19/2014  . Fatigue  12/19/2014  . Acid reflux 12/19/2014  . Hypercholesterolemia 12/19/2014  . Benign essential HTN 12/19/2014  . Insomnia 12/19/2014  . Symptomatic menopausal or female climacteric states 12/19/2014  . Headache, migraine 12/19/2014  . 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 Borderline diabetes 12/19/2014  . Apnea, sleep 12/19/2014  . Compulsive tobacco user syndrome 12/19/2014  . Gait instability 12/19/2014  . B12 deficiency 12/19/2014  . Avitaminosis D 12/19/2014   Past Medical History:  Diagnosis Date  . Hypercholesteremia   . Hypertension   . Migraine    Social History   Tobacco Use  . Smoking status: Current Every Day Smoker    Types: E-cigarettes  . Smokeless tobacco: Never Used  Substance Use Topics  . Alcohol use: No    Alcohol/week: 0.0 standard drinks  . Drug use: No   Allergies  Allergen Reactions  . Sulfa Antibiotics   . Sulfur Other (See Comments)  . Sulfamethoxazole-Trimethoprim Rash     Medications: Outpatient Medications Prior to Visit  Medication Sig  . ALPRAZolam (XANAX) 0.5 MG tablet Take 1 tablet (0.5 mg total) by mouth at bedtime as needed for anxiety.  Marland Kitchen atorvastatin (LIPITOR) 20 MG tablet Take 1 tablet (20 mg total) by mouth daily.  . Boswellia-Glucosamine-Vit D (OSTEO BI-FLEX ONE PER DAY PO) Take by mouth.  . hydrOXYzine (ATARAX/VISTARIL) 10 MG tablet Take 1 tablet (10 mg total) by mouth 3 (three) times daily as needed for itching.  Marland Kitchen levonorgestrel (MIRENA) 20 MCG/24HR IUD by Intrauterine route.  . meloxicam (MOBIC) 7.5 MG tablet TAKE 1 TABLET(7.5 MG) BY MOUTH DAILY  . SUMAtriptan (IMITREX) 100 MG tablet Take 1 tablet (100 mg total) by mouth every 2 (two) hours as needed for migraine. No more than 200mg  in 24 hr period  . [DISCONTINUED] escitalopram (LEXAPRO) 20 MG tablet TAKE 1 TABLET(20 MG) BY MOUTH DAILY  . [DISCONTINUED] Fluocinolone Acetonide 0.01 % OIL SMARTSIG:1-2 Drop(s) In Ear(s) 1 to 2 Times Daily  . [DISCONTINUED] triamcinolone  cream (KENALOG) 0.1 % Apply to aa's arms BID PRN. Avoid f/g/a. (Patient not taking: Reported on 03/03/2020)   No facility-administered medications prior to visit.    Review of Systems  Constitutional: Positive for fatigue. Negative for activity change, appetite change, chills, diaphoresis, fever and unexpected weight change.  Respiratory: Negative.   Cardiovascular: Negative.   Gastrointestinal: Negative.   Musculoskeletal: Positive for arthralgias and joint swelling. Negative for back pain, gait problem, myalgias, neck pain and neck stiffness.  Neurological: Positive for dizziness and headaches. Negative for tingling, weakness, light-headedness and numbness.  Psychiatric/Behavioral: Positive for decreased concentration and sleep disturbance. Negative for agitation, behavioral problems, confusion, dysphoric mood, self-injury and suicidal ideas. The patient is nervous/anxious.     Last CBC Lab Results  Component Value Date   WBC 6.4 06/07/2019   HGB 15.0 06/07/2019   HCT 44.9 06/07/2019   MCV 92 06/07/2019   MCH 30.7 06/07/2019   RDW 12.9 06/07/2019   PLT 232 06/07/2019   Last metabolic panel Lab Results  Component Value Date   GLUCOSE 117 (H) 06/07/2019   NA 140 06/07/2019   K 4.0 06/07/2019   CL 106 06/07/2019  CO2 17 (L) 06/07/2019   BUN 10 06/07/2019   CREATININE 0.83 06/07/2019   GFRNONAA 78 06/07/2019   GFRAA 90 06/07/2019   CALCIUM 9.6 06/07/2019   PROT 7.1 06/07/2019   ALBUMIN 4.5 06/07/2019   LABGLOB 2.6 06/07/2019   AGRATIO 1.7 06/07/2019   BILITOT 0.5 06/07/2019   ALKPHOS 81 06/07/2019   AST 29 06/07/2019   ALT 33 (H) 06/07/2019   Last lipids Lab Results  Component Value Date   CHOL 213 (H) 06/07/2019   HDL 38 (L) 06/07/2019   LDLCALC 135 (H) 06/07/2019   TRIG 222 (H) 06/07/2019   CHOLHDL 5.6 (H) 06/07/2019   Last hemoglobin A1c Lab Results  Component Value Date   HGBA1C 6.3 (H) 06/07/2019   Last thyroid functions Lab Results  Component Value  Date   TSH 2.110 06/07/2019    Objective    BP 131/82 (BP Location: Left Arm, Patient Position: Sitting, Cuff Size: Large)   Pulse 79   Temp 98.6 F (37 C) (Oral)   Wt 190 lb (86.2 kg)   BMI 30.67 kg/m  BP Readings from Last 3 Encounters:  03/03/20 131/82  09/21/19 137/83  07/16/19 137/84    Physical Exam Vitals and nursing note reviewed.  Constitutional:      General: She is not in acute distress.    Appearance: Normal appearance. She is obese. She is not ill-appearing.  HENT:     Head: Normocephalic and atraumatic.     Right Ear: Tympanic membrane, ear canal and external ear normal.     Left Ear: Tympanic membrane, ear canal and external ear normal.  Eyes:     Extraocular Movements: Extraocular movements intact.     Conjunctiva/sclera: Conjunctivae normal.     Pupils: Pupils are equal, round, and reactive to light.  Neck:     Vascular: No carotid bruit.  Cardiovascular:     Rate and Rhythm: Normal rate and regular rhythm.     Pulses: Normal pulses.     Heart sounds: Murmur heard.  Systolic murmur is present.   Pulmonary:     Effort: Pulmonary effort is normal.     Breath sounds: Normal breath sounds.  Abdominal:     General: Abdomen is flat. Bowel sounds are normal.     Palpations: Abdomen is soft.     Tenderness: There is no abdominal tenderness.  Musculoskeletal:     Cervical back: Normal range of motion and neck supple. No tenderness.     Right lower leg: No edema.     Left lower leg: No edema.  Lymphadenopathy:     Cervical: No cervical adenopathy.  Skin:    General: Skin is warm and dry.  Neurological:     Mental Status: She is alert and oriented to person, place, and time. Mental status is at baseline.  Psychiatric:        Mood and Affect: Mood normal.        Behavior: Behavior normal.        Thought Content: Thought content normal.        Judgment: Judgment normal.    No results found for any visits on 03/03/20.  Assessment & Plan     Problem  List Items Addressed This Visit      Cardiovascular and Mediastinum   Benign essential HTN - Primary    Well controlled Pt has been off BP medications for several years. Recheck metabolic panel F/u in 6 months       Relevant  Orders   TSH     Other   GAD (generalized anxiety disorder)    Chronic and uncontrolled.  Pt states her therapy dog is a big help with depression anxiety symptoms. Insomnia maybe causing the worsening symptoms.   Will treat insomnia with trazodone and decrease Lexapro 10mg  a day. Follow up in about four weeks.        Relevant Medications   traZODone (DESYREL) 50 MG tablet   escitalopram (LEXAPRO) 10 MG tablet   Hypercholesterolemia    Previously well controlled Pt reports tolerating atorvastatin well with no side effects. Repeat FLP and CMP Goal LDL < 100       Relevant Orders   Comprehensive metabolic panel   Lipid panel   Insomnia    Chronic and uncontrolled.  Will try Trazodone 50mg  -100mg  at bedtime.       Relevant Medications   traZODone (DESYREL) 50 MG tablet   Borderline diabetes    Pt reports her fasting blood sugar was around 160 this morning.  Discussed the importance of lifestyle changes including diet and exercise to prevent progression to diabetes.  Will check A1C today.       Relevant Orders   Comprehensive metabolic panel   Hemoglobin A1c   B12 deficiency    Will recheck labs today.       Relevant Orders   CBC with Differential/Platelet   Vitamin B12   Avitaminosis D    Has been low in the past, will recheck today.      Relevant Orders   CBC with Differential/Platelet   VITAMIN D 25 Hydroxy (Vit-D Deficiency, Fractures)   Chronic pain of left knee    Still having knee pain after a twisting injury earlier this year.  Discuss using Voltaren gel or lidocaine gel as needed.   Discussed referral to orthopedics to evaluate and treat, pt declines at this time. She will call back if she changes her mind.        Relevant Medications   traZODone (DESYREL) 50 MG tablet   escitalopram (LEXAPRO) 10 MG tablet   Obesity (BMI 30.0-34.9)    Discussed importance of healthy weight management Discussed diet and exercise           Return in about 4 weeks (around 03/31/2020) for Anxiety and Insomnia .      Delmer IslamI, Chace Klippel M Cadience Bradfield, PA-C, have reviewed all documentation for this visit. The documentation on 03/05/20 for the exam, diagnosis, procedures, and orders are all accurate and complete.   Reine JustJennifer M Nainoa Woldt, PA-C  [facility] [phone] (phone) [phone] (fax)  [facility]

## 2020-03-03 NOTE — Patient Instructions (Signed)
Trazodone tablets What is this medicine? TRAZODONE (TRAZ oh done) is used to treat depression. This medicine may be used for other purposes; ask your health care provider or pharmacist if you have questions. COMMON BRAND NAME(S): Desyrel What should I tell my health care provider before I take this medicine? They need to know if you have any of these conditions:  attempted suicide or thinking about it  bipolar disorder  bleeding problems  glaucoma  heart disease, or previous heart attack  irregular heart beat  kidney or liver disease  low levels of sodium in the blood  an unusual or allergic reaction to trazodone, other medicines, foods, dyes or preservatives  pregnant or trying to get pregnant  breast-feeding How should I use this medicine? Take this medicine by mouth with a glass of water. Follow the directions on the prescription label. Take this medicine shortly after a meal or a light snack. Take your medicine at regular intervals. Do not take your medicine more often than directed. Do not stop taking this medicine suddenly except upon the advice of your doctor. Stopping this medicine too quickly may cause serious side effects or your condition may worsen. A special MedGuide will be given to you by the pharmacist with each prescription and refill. Be sure to read this information carefully each time. Talk to your pediatrician regarding the use of this medicine in children. Special care may be needed. Overdosage: If you think you have taken too much of this medicine contact a poison control center or emergency room at once. NOTE: This medicine is only for you. Do not share this medicine with others. What if I miss a dose? If you miss a dose, take it as soon as you can. If it is almost time for your next dose, take only that dose. Do not take double or extra doses. What may interact with this medicine? Do not take this medicine with any of the following  medications:  certain medicines for fungal infections like fluconazole, itraconazole, ketoconazole, posaconazole, voriconazole  cisapride  dronedarone  linezolid  MAOIs like Carbex, Eldepryl, Marplan, Nardil, and Parnate  mesoridazine  methylene blue (injected into a vein)  pimozide  saquinavir  thioridazine This medicine may also interact with the following medications:  alcohol  antiviral medicines for HIV or AIDS  aspirin and aspirin-like medicines  barbiturates like phenobarbital  certain medicines for blood pressure, heart disease, irregular heart beat  certain medicines for depression, anxiety, or psychotic disturbances  certain medicines for migraine headache like almotriptan, eletriptan, frovatriptan, naratriptan, rizatriptan, sumatriptan, zolmitriptan  certain medicines for seizures like carbamazepine and phenytoin  certain medicines for sleep  certain medicines that treat or prevent blood clots like dalteparin, enoxaparin, warfarin  digoxin  fentanyl  lithium  NSAIDS, medicines for pain and inflammation, like ibuprofen or naproxen  other medicines that prolong the QT interval (cause an abnormal heart rhythm) like dofetilide  rasagiline  supplements like St. John's wort, kava kava, valerian  tramadol  tryptophan This list may not describe all possible interactions. Give your health care provider a list of all the medicines, herbs, non-prescription drugs, or dietary supplements you use. Also tell them if you smoke, drink alcohol, or use illegal drugs. Some items may interact with your medicine. What should I watch for while using this medicine? Tell your doctor if your symptoms do not get better or if they get worse. Visit your doctor or health care professional for regular checks on your progress. Because it may take   several weeks to see the full effects of this medicine, it is important to continue your treatment as prescribed by your  doctor. Patients and their families should watch out for new or worsening thoughts of suicide or depression. Also watch out for sudden changes in feelings such as feeling anxious, agitated, panicky, irritable, hostile, aggressive, impulsive, severely restless, overly excited and hyperactive, or not being able to sleep. If this happens, especially at the beginning of treatment or after a change in dose, call your health care professional. Bonita Quin may get drowsy or dizzy. Do not drive, use machinery, or do anything that needs mental alertness until you know how this medicine affects you. Do not stand or sit up quickly, especially if you are an older patient. This reduces the risk of dizzy or fainting spells. Alcohol may interfere with the effect of this medicine. Avoid alcoholic drinks. This medicine may cause dry eyes and blurred vision. If you wear contact lenses you may feel some discomfort. Lubricating drops may help. See your eye doctor if the problem does not go away or is severe. Your mouth may get dry. Chewing sugarless gum, sucking hard candy and drinking plenty of water may help. Contact your doctor if the problem does not go away or is severe. What side effects may I notice from receiving this medicine? Side effects that you should report to your doctor or health care professional as soon as possible:  allergic reactions like skin rash, itching or hives, swelling of the face, lips, or tongue  elevated mood, decreased need for sleep, racing thoughts, impulsive behavior  confusion  fast, irregular heartbeat  feeling faint or lightheaded, falls  feeling agitated, angry, or irritable  loss of balance or coordination  painful or prolonged erections  restlessness, pacing, inability to keep still  suicidal thoughts or other mood changes  tremors  trouble sleeping  seizures  unusual bleeding or bruising Side effects that usually do not require medical attention (report to your doctor  or health care professional if they continue or are bothersome):  change in sex drive or performance  change in appetite or weight  constipation  headache  muscle aches or pains  nausea This list may not describe all possible side effects. Call your doctor for medical advice about side effects. You may report side effects to FDA at 1-800-FDA-1088. Where should I keep my medicine? Keep out of the reach of children. Store at room temperature between 15 and 30 degrees C (59 to 86 degrees F). Protect from light. Keep container tightly closed. Throw away any unused medicine after the expiration date. NOTE: This sheet is a summary. It may not cover all possible information. If you have questions about this medicine, talk to your doctor, pharmacist, or health care provider.  2020 Elsevier/Gold Standard (2018-07-25 11:46:46) Generalized Anxiety Disorder, Adult Generalized anxiety disorder (GAD) is a mental health disorder. People with this condition constantly worry about everyday events. Unlike normal anxiety, worry related to GAD is not triggered by a specific event. These worries also do not fade or get better with time. GAD interferes with life functions, including relationships, work, and school. GAD can vary from mild to severe. People with severe GAD can have intense waves of anxiety with physical symptoms (panic attacks). What are the causes? The exact cause of GAD is not known. What increases the risk? This condition is more likely to develop in:  Women.  People who have a family history of anxiety disorders.  People who are  very shy.  People who experience very stressful life events, such as the death of a loved one.  People who have a very stressful family environment. What are the signs or symptoms? People with GAD often worry excessively about many things in their lives, such as their health and family. They may also be overly concerned about:  Doing well at  work.  Being on time.  Natural disasters.  Friendships. Physical symptoms of GAD include:  Fatigue.  Muscle tension or having muscle twitches.  Trembling or feeling shaky.  Being easily startled.  Feeling like your heart is pounding or racing.  Feeling out of breath or like you cannot take a deep breath.  Having trouble falling asleep or staying asleep.  Sweating.  Nausea, diarrhea, or irritable bowel syndrome (IBS).  Headaches.  Trouble concentrating or remembering facts.  Restlessness.  Irritability. How is this diagnosed? Your health care provider can diagnose GAD based on your symptoms and medical history. You will also have a physical exam. The health care provider will ask specific questions about your symptoms, including how severe they are, when they started, and if they come and go. Your health care provider may ask you about your use of alcohol or drugs, including prescription medicines. Your health care provider may refer you to a mental health specialist for further evaluation. Your health care provider will do a thorough examination and may perform additional tests to rule out other possible causes of your symptoms. To be diagnosed with GAD, a person must have anxiety that:  Is out of his or her control.  Affects several different aspects of his or her life, such as work and relationships.  Causes distress that makes him or her unable to take part in normal activities.  Includes at least three physical symptoms of GAD, such as restlessness, fatigue, trouble concentrating, irritability, muscle tension, or sleep problems. Before your health care provider can confirm a diagnosis of GAD, these symptoms must be present more days than they are not, and they must last for six months or longer. How is this treated? The following therapies are usually used to treat GAD:  Medicine. Antidepressant medicine is usually prescribed for long-term daily control.  Antianxiety medicines may be added in severe cases, especially when panic attacks occur.  Talk therapy (psychotherapy). Certain types of talk therapy can be helpful in treating GAD by providing support, education, and guidance. Options include: ? Cognitive behavioral therapy (CBT). People learn coping skills and techniques to ease their anxiety. They learn to identify unrealistic or negative thoughts and behaviors and to replace them with positive ones. ? Acceptance and commitment therapy (ACT). This treatment teaches people how to be mindful as a way to cope with unwanted thoughts and feelings. ? Biofeedback. This process trains you to manage your body's response (physiological response) through breathing techniques and relaxation methods. You will work with a therapist while machines are used to monitor your physical symptoms.  Stress management techniques. These include yoga, meditation, and exercise. A mental health specialist can help determine which treatment is best for you. Some people see improvement with one type of therapy. However, other people require a combination of therapies. Follow these instructions at home:  Take over-the-counter and prescription medicines only as told by your health care provider.  Try to maintain a normal routine.  Try to anticipate stressful situations and allow extra time to manage them.  Practice any stress management or self-calming techniques as taught by your health care provider.  Do not punish yourself for setbacks or for not making progress.  Try to recognize your accomplishments, even if they are small.  Keep all follow-up visits as told by your health care provider. This is important. Contact a health care provider if:  Your symptoms do not get better.  Your symptoms get worse.  You have signs of depression, such as: ? A persistently sad, cranky, or irritable mood. ? Loss of enjoyment in activities that used to bring you joy. ? Change  in weight or eating. ? Changes in sleeping habits. ? Avoiding friends or family members. ? Loss of energy for normal tasks. ? Feelings of guilt or worthlessness. Get help right away if:  You have serious thoughts about hurting yourself or others. If you ever feel like you may hurt yourself or others, or have thoughts about taking your own life, get help right away. You can go to your nearest emergency department or call:  Your local emergency services (911 in the [place]  A suicide crisis helpline, such as the National Suicide Prevention Lifeline at 650-157-4127. This is open 24 hours a day. Summary  Generalized anxiety disorder (GAD) is a mental health disorder that involves worry that is not triggered by a specific event.  People with GAD often worry excessively about many things in their lives, such as their health and family.  GAD may cause physical symptoms such as restlessness, trouble concentrating, sleep problems, frequent sweating, nausea, diarrhea, headaches, and trembling or muscle twitching.  A mental health specialist can help determine which treatment is best for you. Some people see improvement with one type of therapy. However, other people require a combination of therapies. This information is not intended to replace advice given to you by your health care provider. Make sure you discuss any questions you have with your health care provider. Document Revised: 07/15/2017 Document Reviewed: 06/22/2016 Elsevier Patient Education  2020 [employer].

## 2020-03-03 NOTE — Assessment & Plan Note (Signed)
Well controlled Pt has been off BP medications for several years. Recheck metabolic panel F/u in 6 months

## 2020-03-03 NOTE — Assessment & Plan Note (Signed)
Pt reports her fasting blood sugar was around 160 this morning.  Discussed the importance of lifestyle changes including diet and exercise to prevent progression to diabetes.  Will check A1C today.

## 2020-03-03 NOTE — Assessment & Plan Note (Signed)
Previously well controlled Pt reports tolerating atorvastatin well with no side effects. Repeat FLP and CMP Goal LDL < 100

## 2020-03-04 ENCOUNTER — Telehealth: Payer: Self-pay

## 2020-03-04 LAB — CBC WITH DIFFERENTIAL/PLATELET
Basophils Absolute: 0 10*3/uL (ref 0.0–0.2)
Basos: 1 %
EOS (ABSOLUTE): 0.1 10*3/uL (ref 0.0–0.4)
Eos: 1 %
Hematocrit: 43.3 % (ref 34.0–46.6)
Hemoglobin: 14.8 g/dL (ref 11.1–15.9)
Immature Grans (Abs): 0 10*3/uL (ref 0.0–0.1)
Immature Granulocytes: 0 %
Lymphocytes Absolute: 2.2 10*3/uL (ref 0.7–3.1)
Lymphs: 31 %
MCH: 32.2 pg (ref 26.6–33.0)
MCHC: 34.2 g/dL (ref 31.5–35.7)
MCV: 94 fL (ref 79–97)
Monocytes Absolute: 0.5 10*3/uL (ref 0.1–0.9)
Monocytes: 7 %
Neutrophils Absolute: 4.3 10*3/uL (ref 1.4–7.0)
Neutrophils: 60 %
Platelets: 248 10*3/uL (ref 150–450)
RBC: 4.59 x10E6/uL (ref 3.77–5.28)
RDW: 12.5 % (ref 11.7–15.4)
WBC: 7.1 10*3/uL (ref 3.4–10.8)

## 2020-03-04 LAB — COMPREHENSIVE METABOLIC PANEL
ALT: 22 IU/L (ref 0–32)
AST: 17 IU/L (ref 0–40)
Albumin/Globulin Ratio: 1.8 (ref 1.2–2.2)
Albumin: 4.5 g/dL (ref 3.8–4.9)
Alkaline Phosphatase: 104 IU/L (ref 48–121)
BUN/Creatinine Ratio: 16 (ref 9–23)
BUN: 17 mg/dL (ref 6–24)
Bilirubin Total: 0.3 mg/dL (ref 0.0–1.2)
CO2: 21 mmol/L (ref 20–29)
Calcium: 9.6 mg/dL (ref 8.7–10.2)
Chloride: 105 mmol/L (ref 96–106)
Creatinine, Ser: 1.09 mg/dL — ABNORMAL HIGH (ref 0.57–1.00)
GFR calc Af Amer: 65 mL/min/{1.73_m2} (ref >59)
GFR calc non Af Amer: 56 mL/min/{1.73_m2} — ABNORMAL LOW (ref >59)
Globulin, Total: 2.5 g/dL (ref 1.5–4.5)
Glucose: 214 mg/dL — ABNORMAL HIGH (ref 65–99)
Potassium: 4.1 mmol/L (ref 3.5–5.2)
Sodium: 140 mmol/L (ref 134–144)
Total Protein: 7 g/dL (ref 6.0–8.5)

## 2020-03-04 LAB — LIPID PANEL
Chol/HDL Ratio: 5.2 ratio — ABNORMAL HIGH (ref 0.0–4.4)
Cholesterol, Total: 165 mg/dL (ref 100–199)
HDL: 32 mg/dL — ABNORMAL LOW (ref >39)
LDL Chol Calc (NIH): 94 mg/dL (ref 0–99)
Triglycerides: 226 mg/dL — ABNORMAL HIGH (ref 0–149)
VLDL Cholesterol Cal: 39 mg/dL (ref 5–40)

## 2020-03-04 LAB — TSH: TSH: 2.35 u[IU]/mL (ref 0.450–4.500)

## 2020-03-04 LAB — VITAMIN B12: Vitamin B-12: 221 pg/mL — ABNORMAL LOW (ref 232–1245)

## 2020-03-04 LAB — HEMOGLOBIN A1C
Est. average glucose Bld gHb Est-mCnc: 166 mg/dL
Hgb A1c MFr Bld: 7.4 % — ABNORMAL HIGH (ref 4.8–5.6)

## 2020-03-04 LAB — VITAMIN D 25 HYDROXY (VIT D DEFICIENCY, FRACTURES): Vit D, 25-Hydroxy: 33.5 ng/mL (ref 30.0–100.0)

## 2020-03-04 NOTE — Telephone Encounter (Signed)
Written by Margaretann Loveless, PA-C on 03/04/2020 3:28 PM EDT View Full Comments Seen by patient [name] on 03/04/2020 4:43 PM

## 2020-03-04 NOTE — Telephone Encounter (Signed)
-----   Message from Margaretann Loveless, [place] sent at 03/04/2020  3:28 PM EDT ----- A1c finally resulted and is elevated at 7.4. This is now a diabetic reading. Normally we recommend 3-6 months of diet and exercise with limiting fatty foods, carbohydrates and sugars. Will recheck A1c in 3 months.  If still elevated this would mean you are diabetic and we would discuss in more detail what that means at that time frame.

## 2020-03-04 NOTE — Telephone Encounter (Signed)
Patient advised as directed below and reports that she will have labs done when she comes in at her 4 week flu

## 2020-03-04 NOTE — Telephone Encounter (Signed)
-----   Message from Margaretann Loveless, [place] sent at 03/04/2020 11:27 AM EDT ----- Blood count is normal. Kidney function declined slightly compared to last year. Most likely associated with dehydration for fasting for labs. Push fluids and can recheck in 2-3 weeks. No need for appt just come for labs in 2-3 weeks. Liver enzymes are normal. Sodium, potassium, and calcium are normal. Cholesterol is improved compared to last year. Triglycerides remain elevated. This is part of cholesterol most closely related to diet habits. Limit fatty foods, red meats, processed meats/foods, and sugars in diet. Thyroid is normal. B12 is borderline low. Can take OTC B12 supplementation. Can recheck in 8 weeks to see if absorbing. Vit D is normal, but low normal. Recommend to continue Vit D supplement.

## 2020-03-05 ENCOUNTER — Encounter: Payer: Self-pay | Admitting: Physician Assistant

## 2020-03-13 ENCOUNTER — Ambulatory Visit: Payer: BC Managed Care – PPO | Admitting: Dermatology

## 2020-03-13 ENCOUNTER — Other Ambulatory Visit: Payer: Self-pay

## 2020-03-13 DIAGNOSIS — L219 Seborrheic dermatitis, unspecified: Secondary | ICD-10-CM

## 2020-03-13 DIAGNOSIS — L853 Xerosis cutis: Secondary | ICD-10-CM

## 2020-03-13 DIAGNOSIS — L2081 Atopic neurodermatitis: Secondary | ICD-10-CM | POA: Diagnosis not present

## 2020-03-13 MED ORDER — EUCRISA 2 % EX OINT: 1.0000 "application " | TOPICAL_OINTMENT | CUTANEOUS | 2 refills | Status: DC

## 2020-03-13 NOTE — Patient Instructions (Signed)
Start Allegra 180mg  1 every morning for at least 2 weeks

## 2020-03-13 NOTE — Progress Notes (Signed)
   Follow-Up Visit   Subjective  Lisa Alvarado is a 59 y.o. female who presents for the following: Nummular derm (arms/hands and now possibly on face last visit ~43months,using cerave cream now, worse when she gets hot, has been off TMC 0.1% cr and Hydroxyzine 10mg  since ~09/2019).  The following portions of the chart were reviewed this encounter and updated as appropriate:  Tobacco  Allergies  Meds  Problems  Med Hx  Surg Hx  Fam Hx     Review of Systems:  No other skin or systemic complaints except as noted in HPI or Assessment and Plan.  Objective  Well appearing patient in no apparent distress; mood and affect are within normal limits.  A focused examination was performed including arms, hands, face. Relevant physical exam findings are noted in the Assessment and Plan.  Objective  Left Ear: Clear today  Objective  arms, face: Xerosis   Objective  arms, hands, face: Pink scale face, pink paps arms   Assessment & Plan  Seborrheic dermatitis Left Ear Persistent, but better Controlled Cont Dermotic oil qd up to 5d/wk prn flares  Xerosis cutis arms, face Cont Cerave cream qd  Atopic neurodermatitis arms, hands, face With Pruritis Start Eucrisa oint qd/bid until clear, then prn flares, samples x 5 Lot SDAE, exp 08/2021 Start Allegra 180mg  1 po qam for at least 2 weeks Cont Cerave cream  Pt to call in 2 weeks if not improving  Crisaborole (EUCRISA) 2 % OINT - arms, hands, face  Return for Pt to call in 2 weeks if not improving otherwise PRN.   I, 09/2021, RMA, am acting as scribe for , MD .  Documentation: I have reviewed the above documentation for accuracy and completeness, and I agree with the above.  Ardis Rowan, MD

## 2020-03-18 ENCOUNTER — Telehealth: Payer: Self-pay

## 2020-03-18 ENCOUNTER — Encounter: Payer: Self-pay | Admitting: Dermatology

## 2020-03-18 MED ORDER — TACROLIMUS 0.1 % EX OINT
TOPICAL_OINTMENT | Freq: Two times a day (BID) | CUTANEOUS | 0 refills | Status: DC
Start: 2020-03-18 — End: 2020-09-17

## 2020-03-18 NOTE — Telephone Encounter (Signed)
Prescription sent in and patient advised. 

## 2020-03-18 NOTE — Telephone Encounter (Signed)
Eucrisa PA was denied. OK to change to Tacrolimus?

## 2020-03-18 NOTE — Telephone Encounter (Signed)
Yes.  Change to Tacrolimus

## 2020-04-01 ENCOUNTER — Other Ambulatory Visit: Payer: Self-pay

## 2020-04-01 ENCOUNTER — Telehealth: Payer: Self-pay

## 2020-04-01 MED ORDER — FLUOCINOLONE ACETONIDE BODY 0.01 % EX OIL: TOPICAL_OIL | CUTANEOUS | 1 refills | Status: DC

## 2020-04-01 NOTE — Telephone Encounter (Signed)
We can send in Dermasmoothe FS oil to use all over body if she would like (advise her it is like the medication she uses in her ear). If she wants, send in -- if expensive or non-covered, can get for $40 through mail from manufacturer. Also may consider Dupixent shots.

## 2020-04-01 NOTE — Telephone Encounter (Signed)
Pt called she is not doing any better, she is still itching, Eucrisa ointment and Allegra tablets are not helping, pt would like to know if she can have a different topical cream

## 2020-04-01 NOTE — Telephone Encounter (Signed)
Called pt discussed we will try Derma-smooth fs oil for the body if no better after trying Derma-smooth fs oil we may consider Dupixent injections,   Pt would like Derma-smooth called into [facility] in [place]

## 2020-04-03 ENCOUNTER — Ambulatory Visit: Payer: BC Managed Care – PPO | Admitting: Physician Assistant

## 2020-04-07 ENCOUNTER — Other Ambulatory Visit: Payer: Self-pay

## 2020-04-07 ENCOUNTER — Ambulatory Visit: Payer: BC Managed Care – PPO | Admitting: Physician Assistant

## 2020-04-07 ENCOUNTER — Encounter: Payer: Self-pay | Admitting: Physician Assistant

## 2020-04-07 DIAGNOSIS — J418 Mixed simple and mucopurulent chronic bronchitis: Secondary | ICD-10-CM | POA: Diagnosis not present

## 2020-04-07 DIAGNOSIS — F339 Major depressive disorder, recurrent, unspecified: Secondary | ICD-10-CM

## 2020-04-07 DIAGNOSIS — G4700 Insomnia, unspecified: Secondary | ICD-10-CM

## 2020-04-07 MED ORDER — ESCITALOPRAM OXALATE 10 MG PO TABS: 10.0000 mg | ORAL_TABLET | Freq: Every day | ORAL | 1 refills | Status: DC

## 2020-04-07 MED ORDER — TRAZODONE HCL 50 MG PO TABS: 50.0000 mg | ORAL_TABLET | Freq: Every evening | ORAL | 1 refills | Status: DC | PRN

## 2020-04-07 NOTE — Patient Instructions (Signed)

## 2020-04-07 NOTE — Progress Notes (Signed)
Established patient visit   Patient: Lisa Alvarado   DOB: 1960-12-04   59 y.o. Female  MRN: [id number] Visit Date: 04/07/2020  Today's healthcare provider: Margaretann Loveless, PA-C   Chief Complaint  Patient presents with   Anxiety   Hyperglycemia   Subjective    HPI  Anxiety, Follow-up  She was last seen for anxiety 1 months ago. Changes made at last visit include decreased Lexapro to 10mg  daily and started Trazodone 50mg  every night.   She reports excellent compliance with treatment. She reports excellent tolerance of treatment. She is not having side effects.   She feels her anxiety is moderate and Improved since last visit.  Symptoms: No chest pain No difficulty concentrating  No dizziness Yes fatigue  No feelings of losing control No insomnia  No irritable No palpitations  No panic attacks No racing thoughts  No shortness of breath No sweating  No tremors/shakes    GAD-7 Results GAD-7 Generalized Anxiety Disorder Screening Tool 03/03/2020 07/16/2019 06/11/2019  1. Feeling Nervous, Anxious, or on Edge 2 2 1   2. Not Being Able to Stop or Control Worrying 1 2 3   3. Worrying Too Much About Different Things 1 1 3   4. Trouble Relaxing 1 1 2   5. Being So Restless it's Hard To Sit Still 0 1 0  6. Becoming Easily Annoyed or Irritable 1 3 3   7. Feeling Afraid As If Something Awful Might Happen 0 1 0  Total GAD-7 Score 6 11 12   Difficulty At Work, Home, or Getting  Along With Others? Somewhat difficult Somewhat difficult Somewhat difficult    PHQ-9 Scores PHQ9 SCORE ONLY 03/03/2020 07/16/2019 06/11/2019  PHQ-9 Total Score 12 14 17     ---------------------------------------------------------------------------------------------------   Patient Active Problem List   Diagnosis Date Noted   Obesity (BMI 30.0-34.9) 03/03/2020   Chronic pain of left knee 06/11/2019   Ankle sprain 01/20/2016   Arthralgia of temporomandibular joint 01/31/2015   Allergic  rhinitis 12/19/2014   GAD (generalized anxiety disorder) 12/19/2014   Bronchitis, chronic (HCC) 12/19/2014   Depression, recurrent (HCC) 12/19/2014   Fatigue 12/19/2014   Acid reflux 12/19/2014   Hypercholesterolemia 12/19/2014   Benign essential HTN 12/19/2014   Insomnia 12/19/2014   Symptomatic menopausal or female climacteric states 12/19/2014   Headache, migraine 12/19/2014   Brain syndrome, posttraumatic 12/19/2014   Borderline diabetes 12/19/2014   Apnea, sleep 12/19/2014   Compulsive tobacco user syndrome 12/19/2014   Gait instability 12/19/2014   B12 deficiency 12/19/2014   Avitaminosis D 12/19/2014   Past Medical History:  Diagnosis Date   Dermatitis    Hypercholesteremia    Hypertension    Migraine    Social History   Tobacco Use   Smoking status: Current Every Day Smoker    Types: E-cigarettes   Smokeless tobacco: Never Used  Substance Use Topics   Alcohol use: No    Alcohol/week: 0.0 standard drinks   Drug use: No   Allergies  Allergen Reactions   Sulfa Antibiotics    Sulfur Other (See Comments)   Sulfamethoxazole-Trimethoprim Rash     Medications: Outpatient Medications Prior to Visit  Medication Sig   ALPRAZolam (XANAX) 0.5 MG tablet Take 1 tablet (0.5 mg total) by mouth at bedtime as needed for anxiety.   atorvastatin (LIPITOR) 20 MG tablet Take 1 tablet (20 mg total) by mouth daily.   Boswellia-Glucosamine-Vit D (OSTEO BI-FLEX ONE PER DAY PO) Take by mouth.   Fluocinolone Acetonide 0.01 % OIL  SMARTSIG:1-2 Drop(s) In Ear(s) 1 to 2 Times Daily   Fluocinolone Acetonide Body (DERMA-SMOOTHE/FS BODY) 0.01 % OIL Apply to body bid prn avoid face, groin, axilla   levonorgestrel (MIRENA) 20 MCG/24HR IUD by Intrauterine route.   meloxicam (MOBIC) 7.5 MG tablet TAKE 1 TABLET(7.5 MG) BY MOUTH DAILY   SUMAtriptan (IMITREX) 100 MG tablet Take 1 tablet (100 mg total) by mouth every 2 (two) hours as needed for migraine. No more  than 200mg  in 24 hr period   tacrolimus (PROTOPIC) 0.1 % ointment Apply topically in the morning and at bedtime.   [DISCONTINUED] escitalopram (LEXAPRO) 10 MG tablet Take 1 tablet (10 mg total) by mouth daily.   [DISCONTINUED] traZODone (DESYREL) 50 MG tablet Take 1-2 tablets (50-100 mg total) by mouth at bedtime as needed for sleep.   Crisaborole (EUCRISA) 2 % OINT Apply 1 application topically as directed. Qd to bid to itchy rash on face, arms and hands until clear, then prn flares (Patient not taking: Reported on 04/07/2020)   hydrOXYzine (ATARAX/VISTARIL) 10 MG tablet Take 1 tablet (10 mg total) by mouth 3 (three) times daily as needed for itching. (Patient not taking: Reported on 04/07/2020)   No facility-administered medications prior to visit.    Review of Systems  Constitutional: Positive for fatigue. Negative for activity change, appetite change, chills, diaphoresis, fever and unexpected weight change.  Respiratory: Negative.   Cardiovascular: Positive for leg swelling. Negative for chest pain and palpitations.  Gastrointestinal: Negative.   Neurological: Positive for headaches. Negative for dizziness and light-headedness.  Psychiatric/Behavioral: Negative for agitation, decreased concentration, dysphoric mood, self-injury, sleep disturbance and suicidal ideas. The patient is not nervous/anxious.       Objective    BP (!) 144/89 (BP Location: Right Arm, Patient Position: Sitting, Cuff Size: Large)    Pulse 71    Temp 98.2 F (36.8 C) (Oral)    Wt 192 lb (87.1 kg)    BMI 30.99 kg/m    Physical Exam Vitals reviewed.  Constitutional:      General: She is not in acute distress.    Appearance: Normal appearance. She is well-developed. She is not ill-appearing or diaphoretic.  Cardiovascular:     Rate and Rhythm: Normal rate and regular rhythm.     Heart sounds: Normal heart sounds. No murmur heard.  No friction rub. No gallop.   Pulmonary:     Effort: Pulmonary effort is  normal. No respiratory distress.     Breath sounds: Normal breath sounds. No wheezing or rales.  Musculoskeletal:     Cervical back: Normal range of motion and neck supple.  Neurological:     Mental Status: She is alert.  Psychiatric:        Mood and Affect: Mood normal.        Behavior: Behavior normal.        Thought Content: Thought content normal.        Judgment: Judgment normal.     No results found for any visits on 04/07/20.  Assessment & Plan     1. Mixed simple and mucopurulent chronic bronchitis (HCC) Stable. Currently not needing treatment.   2. Depression, recurrent (HCC) Improving. Continue lexapro as below.  - escitalopram (LEXAPRO) 10 MG tablet; Take 1 tablet (10 mg total) by mouth daily.  Dispense: 90 tablet; Refill: 1  3. Insomnia, unspecified type Improving. Continue trazodone as below.  - traZODone (DESYREL) 50 MG tablet; Take 1-2 tablets (50-100 mg total) by mouth at bedtime as needed for  sleep.  Dispense: 180 tablet; Refill: 1   Return in about 2 months (around 06/07/2020) for A1c.      Delmer Islam, PA-C, have reviewed all documentation for this visit. The documentation on 04/09/20 for the exam, diagnosis, procedures, and orders are all accurate and complete.   Reine Just  [facility] [phone] (phone) [phone] (fax)  [facility]

## 2020-05-29 ENCOUNTER — Other Ambulatory Visit: Payer: Self-pay | Admitting: Physician Assistant

## 2020-05-29 DIAGNOSIS — E7800 Pure hypercholesterolemia, unspecified: Secondary | ICD-10-CM

## 2020-06-07 ENCOUNTER — Other Ambulatory Visit: Payer: Self-pay | Admitting: Physician Assistant

## 2020-06-07 DIAGNOSIS — G8929 Other chronic pain: Secondary | ICD-10-CM

## 2020-06-07 NOTE — Telephone Encounter (Signed)
Requested Prescriptions  Pending Prescriptions Disp Refills  . meloxicam (MOBIC) 7.5 MG tablet [Pharmacy Med Name: MELOXICAM 7.5MG  TABLETS] 90 tablet 1    Sig: TAKE 1 TABLET(7.5 MG) BY MOUTH DAILY     Analgesics:  COX2 Inhibitors Failed - 06/07/2020 12:03 PM      Failed - Cr in normal range and within 360 days    Creatinine, Ser  Date Value Ref Range Status  03/03/2020 1.09 (H) 0.57 - 1.00 mg/dL Final         Passed - HGB in normal range and within 360 days    Hemoglobin  Date Value Ref Range Status  03/03/2020 14.8 11.1 - 15.9 g/dL Final         Passed - Patient is not pregnant      Passed - Valid encounter within last 12 months    Recent Outpatient Visits          2 months ago Mixed simple and mucopurulent chronic bronchitis [facility])   [facility] [place], [place], [place]   3 months ago Benign essential HTN   [facility] [place], [place], [place]   8 months ago Chronic pain of left knee   [facility] [place], [place], [place]   10 months ago GAD (generalized anxiety disorder)   [facility] [place], [place], [place]   12 months ago Chronic migraine without aura without status migrainosus, not intractable   [facility], [place], [place]      Future Appointments            In 1 week Rosezetta Schlatter, [place], PA-C [facility]

## 2020-06-10 NOTE — Progress Notes (Signed)
Established patient visit   Patient: Lisa Alvarado   DOB: 08-21-1960   59 y.o. Female  MRN: [id number] Visit Date: 06/16/2020  Today's healthcare provider: Margaretann Loveless, PA-C   No chief complaint on file.  Subjective    Fall The accident occurred more than 1 week ago. The fall occurred from a bed. There was no blood loss. The point of impact was the left elbow. The pain is present in the left elbow. The symptoms are aggravated by use of injured limb. Pertinent negatives include no headaches. She has tried NSAID for the symptoms. The treatment provided mild relief.    Follow up for Elevated A1C  The patient was last seen for this 3 months ago. Changes made at last visit include A1c elevated at 7.4. This is now a diabetic reading. Normally we recommend 3-6 months of diet and exercise with limiting fatty foods, carbohydrates and sugars. Will recheck A1c in 3 months  She reports excellent compliance with treatment. She is not having side effects.   -----------------------------------------------------------------------------------------   Patient Active Problem List   Diagnosis Date Noted  . Type 2 diabetes mellitus with hyperglycemia, without long-term current use of insulin (HCC) 06/19/2020  . Obesity (BMI 30.0-34.9) 03/03/2020  . Chronic pain of left knee 06/11/2019  . Ankle sprain 01/20/2016  . Arthralgia of temporomandibular joint 01/31/2015  . Allergic rhinitis 12/19/2014  . GAD (generalized anxiety disorder) 12/19/2014  . Bronchitis, chronic (HCC) 12/19/2014  . Depression, recurrent (HCC) 12/19/2014  . Fatigue 12/19/2014  . Acid reflux 12/19/2014  . Hypercholesterolemia 12/19/2014  . Benign essential HTN 12/19/2014  . Insomnia 12/19/2014  . Symptomatic menopausal or female climacteric states 12/19/2014  . Headache, migraine 12/19/2014  . 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 Borderline diabetes 12/19/2014  . Apnea, sleep 12/19/2014  . Compulsive  tobacco user syndrome 12/19/2014  . Gait instability 12/19/2014  . B12 deficiency 12/19/2014  . Avitaminosis D 12/19/2014   Past Medical History:  Diagnosis Date  . Dermatitis   . Hypercholesteremia   . Hypertension   . Migraine    Social History   Tobacco Use  . Smoking status: Current Every Day Smoker    Types: E-cigarettes  . Smokeless tobacco: Never Used  Substance Use Topics  . Alcohol use: No    Alcohol/week: 0.0 standard drinks  . Drug use: No   Allergies  Allergen Reactions  . Sulfa Antibiotics   . Sulfur Other (See Comments)  . Sulfamethoxazole-Trimethoprim Rash     Medications: Outpatient Medications Prior to Visit  Medication Sig  . ALPRAZolam (XANAX) 0.5 MG tablet Take 1 tablet (0.5 mg total) by mouth at bedtime as needed for anxiety.  Marland Kitchen atorvastatin (LIPITOR) 20 MG tablet TAKE 1 TABLET(20 MG) BY MOUTH DAILY  . Boswellia-Glucosamine-Vit D (OSTEO BI-FLEX ONE PER DAY PO) Take by mouth.  . escitalopram (LEXAPRO) 10 MG tablet Take 1 tablet (10 mg total) by mouth daily.  . Fluocinolone Acetonide 0.01 % OIL SMARTSIG:1-2 Drop(s) In Ear(s) 1 to 2 Times Daily  . Fluocinolone Acetonide Body (DERMA-SMOOTHE/FS BODY) 0.01 % OIL Apply to body bid prn avoid face, groin, axilla  . levonorgestrel (MIRENA) 20 MCG/24HR IUD by Intrauterine route.  . meloxicam (MOBIC) 7.5 MG tablet TAKE 1 TABLET(7.5 MG) BY MOUTH DAILY  . SUMAtriptan (IMITREX) 100 MG tablet Take 1 tablet (100 mg total) by mouth every 2 (two) hours as needed for migraine. No more than 200mg  in 24 hr period  . tacrolimus (PROTOPIC) 0.1 %  ointment Apply topically in the morning and at bedtime.  . traZODone (DESYREL) 50 MG tablet Take 1-2 tablets (50-100 mg total) by mouth at bedtime as needed for sleep.  . [DISCONTINUED] Crisaborole (EUCRISA) 2 % OINT Apply 1 application topically as directed. Qd to bid to itchy rash on face, arms and hands until clear, then prn flares (Patient not taking: Reported on 04/07/2020)  .  [DISCONTINUED] hydrOXYzine (ATARAX/VISTARIL) 10 MG tablet Take 1 tablet (10 mg total) by mouth 3 (three) times daily as needed for itching. (Patient not taking: Reported on 04/07/2020)   No facility-administered medications prior to visit.    Review of Systems  Constitutional: Negative.   Respiratory: Negative.   Cardiovascular: Negative.   Gastrointestinal: Negative.   Musculoskeletal: Positive for arthralgias and joint swelling. Negative for back pain, gait problem, myalgias, neck pain and neck stiffness.  Neurological: Positive for dizziness. Negative for light-headedness and headaches.      Objective    BP 128/84 (BP Location: Right Arm, Patient Position: Sitting, Cuff Size: Large)   Pulse 76   Temp 98.5 F (36.9 C) (Oral)   Wt 194 lb (88 kg)   SpO2 96%   BMI 31.31 kg/m    Physical Exam Vitals reviewed.  Constitutional:      General: She is not in acute distress.    Appearance: Normal appearance. She is well-developed. She is not ill-appearing or diaphoretic.  Cardiovascular:     Rate and Rhythm: Normal rate and regular rhythm.     Pulses: Normal pulses.     Heart sounds: Normal heart sounds. No murmur heard.  No friction rub. No gallop.   Pulmonary:     Effort: Pulmonary effort is normal. No respiratory distress.     Breath sounds: Normal breath sounds. No wheezing or rales.  Musculoskeletal:     Right elbow: Normal.     Left elbow: Swelling present. No deformity, effusion or lacerations. Decreased range of motion. Tenderness present in radial head and lateral epicondyle. No olecranon process tenderness.     Cervical back: Normal range of motion and neck supple.  Skin:    General: Skin is warm and dry.     Findings: Bruising (left lateral elbow) present. No erythema.  Neurological:     Mental Status: She is alert.      Results for orders placed or performed in visit on 06/16/20  POCT glycosylated hemoglobin (Hb A1C)  Result Value Ref Range   Hemoglobin A1C  8.3 (A) 4.0 - 5.6 %    Assessment & Plan     1. Type 2 diabetes mellitus with hyperglycemia, without long-term current use of insulin (HCC) A1c increased to 8.3. This is second reading over last 3 months greater than 6.5. Patient diagnosed as diabetic. Agreeable to start metformin as below. Continue working on lifestyle modifications. Discussed diabetic education, but would like to wait at this time. Will also get microalbumin and discuss Pneumovax again at next visit. Patient recently had eye exam and does already get annual exams. Advised to continue. Patient already on statin. Return in 3 months.  - metFORMIN (GLUCOPHAGE) 500 MG tablet; Take 1 tablet (500 mg total) by mouth 2 (two) times daily with a meal.  Dispense: 180 tablet; Refill: 1  2. Fall, initial encounter New. Landed mostly on elbow. Will get xray as below. Will f/u pending imaging results.  - DG Elbow Complete Left; Future  3. Left elbow pain See above medical treatment plan. - DG Elbow Complete Left; Future  4. Need for influenza vaccination Flu vaccine given today without complication. Patient sat upright for 15 minutes to check for adverse reaction before being released. - Flu Vaccine QUAD 6+ mos PF IM (Fluarix Quad PF)   Return in about 3 months (around 09/16/2020) for T2DM.      Delmer Islam, PA-C, have reviewed all documentation for this visit. The documentation on 06/19/20 for the exam, diagnosis, procedures, and orders are all accurate and complete.   Reine Just  [facility] [phone] (phone) [phone] (fax)  [facility]

## 2020-06-16 ENCOUNTER — Other Ambulatory Visit: Payer: Self-pay

## 2020-06-16 ENCOUNTER — Ambulatory Visit: Payer: BC Managed Care – PPO | Admitting: Physician Assistant

## 2020-06-16 ENCOUNTER — Ambulatory Visit
Admission: RE | Admit: 2020-06-16 | Discharge: 2020-06-16 | Disposition: A | Discharge status: Home / Self Care | Payer: BC Managed Care – PPO | Attending: Physician Assistant | Admitting: Physician Assistant

## 2020-06-16 ENCOUNTER — Encounter: Payer: Self-pay | Admitting: Physician Assistant

## 2020-06-16 ENCOUNTER — Ambulatory Visit
Admission: RE | Admit: 2020-06-16 | Discharge: 2020-06-16 | Disposition: A | Discharge status: Home / Self Care | Payer: BC Managed Care – PPO | Source: Ambulatory Visit | Attending: Physician Assistant | Admitting: Physician Assistant

## 2020-06-16 VITALS — BP 128/84 | HR 76 | Temp 98.5°F | Wt 194.0 lb

## 2020-06-16 DIAGNOSIS — R7303 Prediabetes: Secondary | ICD-10-CM

## 2020-06-16 DIAGNOSIS — E1165 Type 2 diabetes mellitus with hyperglycemia: Secondary | ICD-10-CM

## 2020-06-16 DIAGNOSIS — Z23 Encounter for immunization: Secondary | ICD-10-CM

## 2020-06-16 DIAGNOSIS — W19XXXA Unspecified fall, initial encounter: Secondary | ICD-10-CM

## 2020-06-16 DIAGNOSIS — M25522 Pain in left elbow: Secondary | ICD-10-CM | POA: Diagnosis not present

## 2020-06-16 DIAGNOSIS — S5002XA Contusion of left elbow, initial encounter: Secondary | ICD-10-CM | POA: Diagnosis not present

## 2020-06-16 LAB — POCT GLYCOSYLATED HEMOGLOBIN (HGB A1C): Hemoglobin A1C: 8.3 % — AB (ref 4.0–5.6)

## 2020-06-16 MED ORDER — METFORMIN HCL 500 MG PO TABS: 500.0000 mg | ORAL_TABLET | Freq: Two times a day (BID) | ORAL | 1 refills | Status: DC

## 2020-06-16 NOTE — Patient Instructions (Signed)
Metformin tablets What is this medicine? METFORMIN (met FOR min) is used to treat type 2 diabetes. It helps to control blood sugar. Treatment is combined with diet and exercise. This medicine can be used alone or with other medicines for diabetes. This medicine may be used for other purposes; ask your health care provider or pharmacist if you have questions. COMMON BRAND NAME(S): Glucophage What should I tell my health care provider before I take this medicine? They need to know if you have any of these conditions:  anemia  dehydration  heart disease  frequently drink alcohol-containing beverages  kidney disease  liver disease  polycystic ovary syndrome  serious infection or injury  vomiting  an unusual or allergic reaction to metformin, other medicines, foods, dyes, or preservatives  pregnant or trying to get pregnant  breast-feeding How should I use this medicine? Take this medicine by mouth with a glass of water. Follow the directions on the prescription label. Take this medicine with food. Take your medicine at regular intervals. Do not take your medicine more often than directed. Do not stop taking except on your doctor's advice. Talk to your pediatrician regarding the use of this medicine in children. While this drug may be prescribed for children as young as 10 years of age for selected conditions, precautions do apply. Overdosage: If you think you have taken too much of this medicine contact a poison control center or emergency room at once. NOTE: This medicine is only for you. Do not share this medicine with others. What if I miss a dose? If you miss a dose, take it as soon as you can. If it is almost time for your next dose, take only that dose. Do not take double or extra doses. What may interact with this medicine? Do not take this medicine with any of the following medications:  certain contrast medicines given before X-rays, CT scans, MRI, or other  procedures  dofetilide This medicine may also interact with the following medications:  acetazolamide  alcohol  certain antivirals for HIV or hepatitis  certain medicines for blood pressure, heart disease, irregular heart beat  cimetidine  dichlorphenamide  digoxin  diuretics  female hormones, like estrogens or progestins and birth control pills  glycopyrrolate  isoniazid  lamotrigine  memantine  methazolamide  metoclopramide  midodrine  niacin  phenothiazines like chlorpromazine, mesoridazine, prochlorperazine, thioridazine  phenytoin  ranolazine  steroid medicines like prednisone or cortisone  stimulant medicines for attention disorders, weight loss, or to stay awake  thyroid medicines  topiramate  trospium  vandetanib  zonisamide This list may not describe all possible interactions. Give your health care provider a list of all the medicines, herbs, non-prescription drugs, or dietary supplements you use. Also tell them if you smoke, drink alcohol, or use illegal drugs. Some items may interact with your medicine. What should I watch for while using this medicine? Visit your doctor or health care professional for regular checks on your progress. A test called the HbA1C (A1C) will be monitored. This is a simple blood test. It measures your blood sugar control over the last 2 to 3 months. You will receive this test every 3 to 6 months. Learn how to check your blood sugar. Learn the symptoms of low and high blood sugar and how to manage them. Always carry a quick-source of sugar with you in case you have symptoms of low blood sugar. Examples include hard sugar candy or glucose tablets. Make sure others know that you can choke   if you eat or drink when you develop serious symptoms of low blood sugar, such as seizures or unconsciousness. They must get medical help at once. Tell your doctor or health care professional if you have high blood sugar. You might  need to change the dose of your medicine. If you are sick or exercising more than usual, you might need to change the dose of your medicine. Do not skip meals. Ask your doctor or health care professional if you should avoid alcohol. Many nonprescription cough and cold products contain sugar or alcohol. These can affect blood sugar. This medicine may cause ovulation in premenopausal women who do not have regular monthly periods. This may increase your chances of becoming pregnant. You should not take this medicine if you become pregnant or think you may be pregnant. Talk with your doctor or health care professional about your birth control options while taking this medicine. Contact your doctor or health care professional right away if you think you are pregnant. If you are going to need surgery, a MRI, CT scan, or other procedure, tell your doctor that you are taking this medicine. You may need to stop taking this medicine before the procedure. Wear a medical ID bracelet or chain, and carry a card that describes your disease and details of your medicine and dosage times. This medicine may cause a decrease in folic acid and vitamin B12. You should make sure that you get enough vitamins while you are taking this medicine. Discuss the foods you eat and the vitamins you take with your health care professional. What side effects may I notice from receiving this medicine? Side effects that you should report to your doctor or health care professional as soon as possible:  allergic reactions like skin rash, itching or hives, swelling of the face, lips, or tongue  breathing problems  feeling faint or lightheaded, falls  muscle aches or pains  signs and symptoms of low blood sugar such as feeling anxious, confusion, dizziness, increased hunger, unusually weak or tired, sweating, shakiness, cold, irritable, headache, blurred vision, fast heartbeat, loss of consciousness  slow or irregular  heartbeat  unusual stomach pain or discomfort  unusually tired or weak Side effects that usually do not require medical attention (report to your doctor or health care professional if they continue or are bothersome):  diarrhea  headache  heartburn  metallic taste in mouth  nausea  stomach gas, upset This list may not describe all possible side effects. Call your doctor for medical advice about side effects. You may report side effects to FDA at 1-800-FDA-1088. Where should I keep my medicine? Keep out of the reach of children. Store at room temperature between 15 and 30 degrees C (59 and 86 degrees F). Protect from moisture and light. Throw away any unused medicine after the expiration date. NOTE: This sheet is a summary. It may not cover all possible information. If you have questions about this medicine, talk to your doctor, pharmacist, or health care provider.  2020 Elsevier/Gold Standard (2017-09-08 19:15:19)  

## 2020-06-19 ENCOUNTER — Encounter: Payer: Self-pay | Admitting: Physician Assistant

## 2020-06-19 DIAGNOSIS — E1165 Type 2 diabetes mellitus with hyperglycemia: Secondary | ICD-10-CM | POA: Insufficient documentation

## 2020-09-17 ENCOUNTER — Ambulatory Visit: Payer: BC Managed Care – PPO | Admitting: Physician Assistant

## 2020-09-17 ENCOUNTER — Other Ambulatory Visit: Payer: Self-pay

## 2020-09-17 ENCOUNTER — Encounter: Payer: Self-pay | Admitting: Physician Assistant

## 2020-09-17 VITALS — BP 138/94 | HR 73 | Temp 98.4°F | Wt 188.0 lb

## 2020-09-17 DIAGNOSIS — M25562 Pain in left knee: Secondary | ICD-10-CM

## 2020-09-17 DIAGNOSIS — E1165 Type 2 diabetes mellitus with hyperglycemia: Secondary | ICD-10-CM

## 2020-09-17 DIAGNOSIS — E7800 Pure hypercholesterolemia, unspecified: Secondary | ICD-10-CM | POA: Diagnosis not present

## 2020-09-17 DIAGNOSIS — I10 Essential (primary) hypertension: Secondary | ICD-10-CM | POA: Diagnosis not present

## 2020-09-17 DIAGNOSIS — G4700 Insomnia, unspecified: Secondary | ICD-10-CM

## 2020-09-17 DIAGNOSIS — G43709 Chronic migraine without aura, not intractable, without status migrainosus: Secondary | ICD-10-CM

## 2020-09-17 DIAGNOSIS — Z23 Encounter for immunization: Secondary | ICD-10-CM

## 2020-09-17 DIAGNOSIS — J418 Mixed simple and mucopurulent chronic bronchitis: Secondary | ICD-10-CM

## 2020-09-17 DIAGNOSIS — F339 Major depressive disorder, recurrent, unspecified: Secondary | ICD-10-CM

## 2020-09-17 DIAGNOSIS — G8929 Other chronic pain: Secondary | ICD-10-CM

## 2020-09-17 LAB — POCT GLYCOSYLATED HEMOGLOBIN (HGB A1C): Hemoglobin A1C: 6.9 % — AB (ref 4.0–5.6)

## 2020-09-17 MED ORDER — ATORVASTATIN CALCIUM 20 MG PO TABS: ORAL_TABLET | ORAL | 1 refills | Status: DC

## 2020-09-17 MED ORDER — SUMATRIPTAN SUCCINATE 100 MG PO TABS: 100.0000 mg | ORAL_TABLET | ORAL | 5 refills | Status: DC | PRN

## 2020-09-17 MED ORDER — ESCITALOPRAM OXALATE 10 MG PO TABS: 10.0000 mg | ORAL_TABLET | ORAL | 1 refills | Status: DC

## 2020-09-17 MED ORDER — MELOXICAM 7.5 MG PO TABS: ORAL_TABLET | ORAL | 1 refills | Status: DC

## 2020-09-17 MED ORDER — METFORMIN HCL 500 MG PO TABS: 500.0000 mg | ORAL_TABLET | Freq: Every day | ORAL | 1 refills | Status: DC

## 2020-09-17 MED ORDER — TRAZODONE HCL 50 MG PO TABS: 50.0000 mg | ORAL_TABLET | Freq: Every evening | ORAL | 1 refills | Status: DC | PRN

## 2020-09-17 NOTE — Progress Notes (Signed)
Established patient visit   Patient: Lisa Alvarado   DOB: October 18, 1960   60 y.o. Female  MRN: [id number] Visit Date: 09/17/2020  Today's healthcare provider: Margaretann Loveless, PA-C   Chief Complaint  Patient presents with  . Hyperlipidemia  . Hypertension  . Diabetes   Subjective    HPI  Diabetes Mellitus Type II, follow-up  Lab Results  Component Value Date   HGBA1C 6.9 (A) 09/17/2020   HGBA1C 8.3 (A) 06/16/2020   HGBA1C 7.4 (H) 03/03/2020   Last seen for diabetes 3 months ago.  Management since then includes starting metformin  She reports excellent compliance with treatment. She is having side effects. Pt reports having some loose bowels.  Home blood sugar records: trend: decreasing steadily  Episodes of hypoglycemia? Yes only a few times.   Current insulin regiment: none Most Recent Eye Exam: UTD  --------------------------------------------------------------------------------------------------- Hypertension, follow-up  BP Readings from Last 3 Encounters:  09/17/20 (!) 138/94  06/16/20 128/84  04/07/20 (!) 144/89   Wt Readings from Last 3 Encounters:  09/17/20 188 lb (85.3 kg)  06/16/20 194 lb (88 kg)  04/07/20 192 lb (87.1 kg)     She was last seen for hypertension 3 months ago.  She reports excellent compliance with treatment. She is not having side effects.  She is not exercising. She is adherent to low salt diet.   Outside blood pressures are not being checked.  She does smoke.  Use of agents associated with hypertension: none.   --------------------------------------------------------------------------------------------------- Lipid/Cholesterol, follow-up  Last Lipid Panel: Lab Results  Component Value Date   CHOL 165 03/03/2020   LDLCALC 94 03/03/2020   HDL 32 (L) 03/03/2020   TRIG 226 (H) 03/03/2020    She was last seen for this 3 months ago.  Management since that visit includes no changes.  She reports excellent  compliance with treatment. She is not having side effects.   Symptoms: No appetite changes No foot ulcerations  No chest pain No chest pressure/discomfort  No dyspnea No orthopnea  No fatigue No lower extremity edema  No palpitations No paroxysmal nocturnal dyspnea  No nausea No numbness or tingling of extremity  No polydipsia No polyuria  No speech difficulty No syncope   She is following a Regular diet. Current exercise: none  Last metabolic panel Lab Results  Component Value Date   GLUCOSE 214 (H) 03/03/2020   NA 140 03/03/2020   K 4.1 03/03/2020   BUN 17 03/03/2020   CREATININE 1.09 (H) 03/03/2020   GFRNONAA 56 (L) 03/03/2020   GFRAA 65 03/03/2020   CALCIUM 9.6 03/03/2020   AST 17 03/03/2020   ALT 22 03/03/2020   The 10-year ASCVD risk score Denman George DC Jr., et al., 2013) is: 22.6%  ---------------------------------------------------------------------------------------------------   Patient Active Problem List   Diagnosis Date Noted  . Type 2 diabetes mellitus with hyperglycemia, without long-term current use of insulin (HCC) 06/19/2020  . Obesity (BMI 30.0-34.9) 03/03/2020  . Chronic pain of left knee 06/11/2019  . Ankle sprain 01/20/2016  . Arthralgia of temporomandibular joint 01/31/2015  . Allergic rhinitis 12/19/2014  . GAD (generalized anxiety disorder) 12/19/2014  . Bronchitis, chronic (HCC) 12/19/2014  . Depression, recurrent (HCC) 12/19/2014  . Fatigue 12/19/2014  . Acid reflux 12/19/2014  . Hypercholesterolemia 12/19/2014  . Benign essential HTN 12/19/2014  . Insomnia 12/19/2014  . Symptomatic menopausal or female climacteric states 12/19/2014  . Headache, migraine 12/19/2014  . 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 Borderline diabetes  12/19/2014  . Apnea, sleep 12/19/2014  . Compulsive tobacco user syndrome 12/19/2014  . Gait instability 12/19/2014  . B12 deficiency 12/19/2014  . Avitaminosis D 12/19/2014   Past Medical History:   Diagnosis Date  . Dermatitis   . Hypercholesteremia   . Hypertension   . Migraine    Social History   Tobacco Use  . Smoking status: Current Every Day Smoker    Types: E-cigarettes  . Smokeless tobacco: Never Used  Substance Use Topics  . Alcohol use: No    Alcohol/week: 0.0 standard drinks  . Drug use: No   Allergies  Allergen Reactions  . Elemental Sulfur Other (See Comments)  . Sulfa Antibiotics   . Sulfamethoxazole-Trimethoprim Rash     Medications: Outpatient Medications Prior to Visit  Medication Sig  . ALPRAZolam (XANAX) 0.5 MG tablet Take 1 tablet (0.5 mg total) by mouth at bedtime as needed for anxiety.  Marland Kitchen atorvastatin (LIPITOR) 20 MG tablet TAKE 1 TABLET(20 MG) BY MOUTH DAILY  . Boswellia-Glucosamine-Vit D (OSTEO BI-FLEX ONE PER DAY PO) Take by mouth.  . escitalopram (LEXAPRO) 10 MG tablet Take 1 tablet (10 mg total) by mouth daily.  . Fluocinolone Acetonide 0.01 % OIL SMARTSIG:1-2 Drop(s) In Ear(s) 1 to 2 Times Daily  . Fluocinolone Acetonide Body (DERMA-SMOOTHE/FS BODY) 0.01 % OIL Apply to body bid prn avoid face, groin, axilla  . levonorgestrel (MIRENA) 20 MCG/24HR IUD by Intrauterine route.  . meloxicam (MOBIC) 7.5 MG tablet TAKE 1 TABLET(7.5 MG) BY MOUTH DAILY  . metFORMIN (GLUCOPHAGE) 500 MG tablet Take 1 tablet (500 mg total) by mouth 2 (two) times daily with a meal.  . SUMAtriptan (IMITREX) 100 MG tablet Take 1 tablet (100 mg total) by mouth every 2 (two) hours as needed for migraine. No more than 200mg  in 24 hr period  . traZODone (DESYREL) 50 MG tablet Take 1-2 tablets (50-100 mg total) by mouth at bedtime as needed for sleep.  tacrolimus (PROTOPIC) 0.1 % ointment Apply topically in the morning and at bedtime. (Patient not taking: Reported on 09/17/2020)   No facility-administered medications prior to visit.    Review of Systems  Constitutional: Negative.   Respiratory: Negative.   Cardiovascular: Positive for leg swelling. Negative for chest pain  and palpitations.  Gastrointestinal: Positive for diarrhea. Negative for abdominal distention, abdominal pain, anal bleeding, blood in stool, constipation, nausea, rectal pain and vomiting.  Endocrine: Positive for polydipsia. Negative for cold intolerance, heat intolerance, polyphagia and polyuria.  Musculoskeletal: Positive for arthralgias.  Skin: Negative for wound.  Neurological: Negative for numbness.        Objective    BP (!) 138/94 (BP Location: Left Arm, Patient Position: Sitting, Cuff Size: Large)   Pulse 73   Temp 98.4 F (36.9 C) (Oral)   Wt 188 lb (85.3 kg)   BMI 30.34 kg/m    Physical Exam Vitals reviewed.  Constitutional:      General: She is not in acute distress.    Appearance: Normal appearance. She is well-developed and well-nourished. She is not ill-appearing or diaphoretic.  HENT:     Head: Normocephalic and atraumatic.  Cardiovascular:     Rate and Rhythm: Normal rate and regular rhythm.     Heart sounds: Normal heart sounds. No murmur heard. No friction rub. No gallop.   Pulmonary:     Effort: Pulmonary effort is normal. No respiratory distress.     Breath sounds: Normal breath sounds. No wheezing or rales.  Musculoskeletal:  Cervical back: Normal range of motion and neck supple.  Neurological:     Mental Status: She is alert.  Psychiatric:        Mood and Affect: Mood normal.        Thought Content: Thought content normal.      Results for orders placed or performed in visit on 09/17/20  POCT glycosylated hemoglobin (Hb A1C)  Result Value Ref Range   Hemoglobin A1C 6.9 (A) 4.0 - 5.6 %    Assessment & Plan     1. Benign essential HTN Stable. Continue low salt diet.   2. Type 2 diabetes mellitus with hyperglycemia, without long-term current use of insulin (HCC) A1c has improved from 8.3 to now 6.9. Pneumonia vaccine updated. Continue metformin as below. On a statin. F/U in 3-6 months.  - POCT glycosylated hemoglobin (Hb A1C) -  metFORMIN (GLUCOPHAGE) 500 MG tablet; Take 1 tablet (500 mg total) by mouth daily with breakfast.  Dispense: 90 tablet; Refill: 1 - Pneumococcal polysaccharide vaccine 23-valent greater than or equal to 2yo subcutaneous/IM  3. Hypercholesterolemia Stable. Diagnosis pulled for medication refill. Continue current medical treatment plan. - atorvastatin (LIPITOR) 20 MG tablet; TAKE 1 TABLET(20 MG) BY MOUTH DAILY  Dispense: 90 tablet; Refill: 1  4. Depression, recurrent (HCC) Stable. Diagnosis pulled for medication refill. Continue current medical treatment plan. - escitalopram (LEXAPRO) 10 MG tablet; Take 1 tablet (10 mg total) by mouth every other day.  Dispense: 45 tablet; Refill: 1  5. Chronic pain of left knee Stable. Diagnosis pulled for medication refill. Continue current medical treatment plan. - meloxicam (MOBIC) 7.5 MG tablet; TAKE 1 TABLET(7.5 MG) BY MOUTH DAILY  Dispense: 90 tablet; Refill: 1  6. Chronic migraine without aura without status migrainosus, not intractable Stable. Diagnosis pulled for medication refill. Continue current medical treatment plan. - SUMAtriptan (IMITREX) 100 MG tablet; Take 1 tablet (100 mg total) by mouth every 2 (two) hours as needed for migraine. No more than 200mg  in 24 hr period  Dispense: 10 tablet; Refill: 5  7. Insomnia, unspecified type Stable. Diagnosis pulled for medication refill. Continue current medical treatment plan. - traZODone (DESYREL) 50 MG tablet; Take 1-2 tablets (50-100 mg total) by mouth at bedtime as needed for sleep.  Dispense: 180 tablet; Refill: 1  8. Mixed simple and mucopurulent chronic bronchitis (HCC) Stable.    No follow-ups on file.      , PA-C, have reviewed all documentation for this visit. The documentation on 10/03/20 for the exam, diagnosis, procedures, and orders are all accurate and complete.   10/05/20  [facility] [phone] (phone) [phone]  (fax)  [facility]

## 2020-10-03 ENCOUNTER — Encounter: Payer: Self-pay | Admitting: Physician Assistant

## 2020-10-20 ENCOUNTER — Encounter: Payer: Self-pay | Admitting: Physician Assistant

## 2020-10-20 DIAGNOSIS — E1165 Type 2 diabetes mellitus with hyperglycemia: Secondary | ICD-10-CM

## 2020-10-20 NOTE — Telephone Encounter (Signed)
Ok to send in higher dose of metformin that she was previously on BID.  Please let patient know.

## 2020-10-24 MED ORDER — METFORMIN HCL 500 MG PO TABS: 500.0000 mg | ORAL_TABLET | Freq: Two times a day (BID) | ORAL | 1 refills | Status: DC

## 2020-10-29 ENCOUNTER — Telehealth: Payer: Self-pay

## 2020-10-29 NOTE — Telephone Encounter (Signed)
Patient wants Antony Contras to "hand pick" her next physican since Marcelino Duster will not be with [facility] after April 20th.  Patient is needing to be rescheduled from 12-16-20 for a three month follow up (Former Jenni pt) Maybe Shingles vaccine   Please call patient back at [phone] with Jenni's choice of provider.  Thanks, [employer]

## 2020-10-30 NOTE — Telephone Encounter (Signed)
Pt advised.  She is scheduled with Dr. Sherrie Mustache at 9:40 on 12/16/2020  Thanks,   -Vernona Rieger

## 2020-10-30 NOTE — Telephone Encounter (Signed)
She would probably do best with Dr. Sherrie Mustache until the new providers start. She could then transition to one of the newer providers/

## 2020-12-05 ENCOUNTER — Other Ambulatory Visit: Payer: Self-pay | Admitting: Dermatology

## 2020-12-15 NOTE — Progress Notes (Signed)
Established patient visit   Patient: Lisa Alvarado   DOB: [birth year]   60 y.o. Female  MRN: [id number] Visit Date: 12/16/2020  Today's healthcare provider: Mila Merry, MD   Chief Complaint  Patient presents with  . Diabetes  . Depression   I,Porsha C McClurkin,acting as a scribe for Mila Merry, MD.,have documented all relevant documentation on the behalf of Mila Merry, MD,as directed by  Mila Merry, MD while in the presence of Mila Merry, MD.   Subjective    HPI  Diabetes Mellitus Type II, Follow-up  Lab Results  Component Value Date   HGBA1C 6.7 (A) 12/16/2020   HGBA1C 6.9 (A) 09/17/2020   HGBA1C 8.3 (A) 06/16/2020   Wt Readings from Last 3 Encounters:  12/16/20 189 lb 9.6 oz (86 kg)  09/17/20 188 lb (85.3 kg)  06/16/20 194 lb (88 kg)   Last seen for diabetes 3 months ago.  Management since then includes changing to a higher dose of metformin that she was previously on BID. She reports good compliance with treatment. She is not having side effects.  Symptoms: No fatigue No foot ulcerations  No appetite changes No nausea  No paresthesia of the feet  No polydipsia  No polyuria No visual disturbances   No vomiting     Home blood sugar records: fasting range: 140's-180's  Episodes of hypoglycemia? No    Current insulin regiment: none Most Recent Eye Exam: [facility] in August.  Current exercise: housecleaning and walking Current diet habits: low fat/ cholesterol, low salt  Pertinent Labs: Lab Results  Component Value Date   CHOL 165 03/03/2020   HDL 32 (L) 03/03/2020   LDLCALC 94 03/03/2020   TRIG 226 (H) 03/03/2020   CHOLHDL 5.2 (H) 03/03/2020   Lab Results  Component Value Date   NA 140 03/03/2020   K 4.1 03/03/2020   CREATININE 1.09 (H) 03/03/2020   GFRNONAA 56 (L) 03/03/2020   GFRAA 65 03/03/2020   GLUCOSE 214 (H) 03/03/2020      ---------------------------------------------------------------------------------------------------  Depression, Follow-up  She  was last seen for this 3 months ago (seen by Joycelyn Man, PA-C). Changes made at last visit include none; continue same medication. She reports not taking the medications due to pet therapy.  Current symptoms include: none She feels she is Improved since last visit.  Depression screen [facility] 2/9 12/16/2020 09/17/2020 06/16/2020  Decreased Interest 1 1 1   Down, Depressed, Hopeless 0 1 0  PHQ - 2 Score 1 2 1   Altered sleeping 0 0 2  Tired, decreased energy 1 2 2   Change in appetite 2 2 0  Feeling bad or failure about yourself  0 0 0  Trouble concentrating 1 1 0  Moving slowly or fidgety/restless 0 0 0  Suicidal thoughts 0 0 0  PHQ-9 Score 5 7 5   Difficult doing work/chores Not difficult at all Not difficult at all Not difficult at all    -----------------------------------------------------------------------------------------      Medications: Outpatient Medications Prior to Visit  Medication Sig  . atorvastatin (LIPITOR) 20 MG tablet TAKE 1 TABLET(20 MG) BY MOUTH DAILY  . Boswellia-Glucosamine-Vit D (OSTEO BI-FLEX ONE PER DAY PO) Take by mouth.  . Fluocinolone Acetonide 0.01 % OIL SMARTSIG:1-2 Drop(s) In Ear(s) 1 to 2 Times Daily  . Fluocinolone Acetonide Body 0.01 % OIL APPLY TOPICALLY TO BODY TWICE DAILY AS NEEDED. AVOID FACE, GROIN, AXILLA  . levonorgestrel (MIRENA) 20 MCG/24HR IUD by Intrauterine route.  . meloxicam (MOBIC)  7.5 MG tablet TAKE 1 TABLET(7.5 MG) BY MOUTH DAILY  . metFORMIN (GLUCOPHAGE) 500 MG tablet Take 1 tablet (500 mg total) by mouth 2 (two) times daily with a meal.  . SUMAtriptan (IMITREX) 100 MG tablet Take 1 tablet (100 mg total) by mouth every 2 (two) hours as needed for migraine. No more than 200mg  in 24 hr period  . escitalopram (LEXAPRO) 10 MG tablet Take 1 tablet (10 mg total) by mouth every other day. (Patient not  taking: Reported on 12/16/2020)  . traZODone (DESYREL) 50 MG tablet Take 1-2 tablets (50-100 mg total) by mouth at bedtime as needed for sleep. (Patient not taking: Reported on 12/16/2020)   No facility-administered medications prior to visit.    Review of Systems  Constitutional: Negative.   Cardiovascular: Negative.   Psychiatric/Behavioral: Negative for agitation, confusion, decreased concentration, self-injury, sleep disturbance and suicidal ideas. The patient is not nervous/anxious.        Objective    BP 131/84 (BP Location: Left Arm, Patient Position: Sitting, Cuff Size: Large)   Pulse 81   Wt 189 lb 9.6 oz (86 kg)   SpO2 100%   BMI 30.60 kg/m     Physical Exam   General: Appearance:    Overweight female in no acute distress  Eyes:    PERRL, conjunctiva/corneas clear, EOM's intact       Lungs:     Clear to auscultation bilaterally, respirations unlabored  Heart:    Normal heart rate. Normal rhythm. No murmurs, rubs, or gallops.   MS:   All extremities are intact.   Neurologic:   Awake, alert, oriented x 3. No apparent focal neurological           defect.         Results for orders placed or performed in visit on 12/16/20  POCT HgB A1C  Result Value Ref Range   Hemoglobin A1C 6.7 (A) 4.0 - 5.6 %   Est. average glucose Bld gHb Est-mCnc 146     Assessment & Plan     1. Type 2 diabetes mellitus with hyperglycemia, without long-term current use of insulin (HCC) Is having some issues with upset stomach on current metformin. Will change to ER formulation with next refill. She doesn't want to throw away any of her current metformin tablets. Sugars are still a little and she is very interested in medication for diabetes that would help lose weight.   Try samples  dapagliflozin propanediol (FARXIGA) 5 MG TABS tablet; Take 1 tablet (5 mg total) by mouth daily before breakfast.  Dispense: 21 tablet; Refill: 0  2. Chronic pain of left knee Onset after twisting injury last year,  suspect meniscal injury. Recommend orthopedic referral. She states she will call back for referral once her schedule is better.   3. Depression, recurrent (HCC) She feels current dose of escitalopram is working well and she wishes to continue unchanged.          The entirety of the information documented in the History of Present Illness, Review of Systems and Physical Exam were personally obtained by me. Portions of this information were initially documented by the CMA and reviewed by me for thoroughness and accuracy.      02/15/21, MD  [facility] [phone] (phone) [phone] (fax)  [facility]

## 2020-12-16 ENCOUNTER — Ambulatory Visit: Payer: BC Managed Care – PPO | Admitting: Family Medicine

## 2020-12-16 ENCOUNTER — Other Ambulatory Visit: Payer: Self-pay

## 2020-12-16 ENCOUNTER — Encounter: Payer: Self-pay | Admitting: Family Medicine

## 2020-12-16 VITALS — BP 131/84 | HR 81 | Wt 189.6 lb

## 2020-12-16 DIAGNOSIS — M25562 Pain in left knee: Secondary | ICD-10-CM

## 2020-12-16 DIAGNOSIS — G8929 Other chronic pain: Secondary | ICD-10-CM

## 2020-12-16 DIAGNOSIS — F339 Major depressive disorder, recurrent, unspecified: Secondary | ICD-10-CM | POA: Diagnosis not present

## 2020-12-16 DIAGNOSIS — E1165 Type 2 diabetes mellitus with hyperglycemia: Secondary | ICD-10-CM

## 2020-12-16 LAB — POCT GLYCOSYLATED HEMOGLOBIN (HGB A1C)
Est. average glucose Bld gHb Est-mCnc: 146
Hemoglobin A1C: 6.7 % — AB (ref 4.0–5.6)

## 2020-12-16 MED ORDER — DAPAGLIFLOZIN PROPANEDIOL 5 MG PO TABS: 5.0000 mg | ORAL_TABLET | Freq: Every day | ORAL | 0 refills | Status: DC

## 2020-12-16 NOTE — Patient Instructions (Addendum)
.   Please review the attached list of medications and notify my office if there are any errors.   . Send me a message when your are ready to go to [facility] to work on your knee   We'll send in a prescription for extended release metformin with your next refill which should be easier on your stomach.    Start taking Farxiga 5mg  once every morning. It will probably make you need to urinate more. Be sure to completely dry yourself to prevent yeast infections.    Well need to check labs about 3 weeks after starting Farxiga to make sure it doesn't drop your sodium or potassium levels.

## 2020-12-27 ENCOUNTER — Encounter (INDEPENDENT_AMBULATORY_CARE_PROVIDER_SITE_OTHER): Payer: Self-pay

## 2020-12-30 ENCOUNTER — Telehealth: Payer: Self-pay | Admitting: Family Medicine

## 2020-12-30 DIAGNOSIS — I10 Essential (primary) hypertension: Secondary | ICD-10-CM

## 2020-12-30 NOTE — Telephone Encounter (Signed)
Tried calling patient. Left message to call back. OK for PEC triage to advise.  ?

## 2020-12-30 NOTE — Telephone Encounter (Signed)
Patient was given samples Farxiga at her appt earlier this month. Please advise we need to check electrolytes before send in prescription to make sure the medication is not affecting her sodium and potassium levels. Have printed order for her. Can leave at lab. Does not need to be fasting.

## 2020-12-30 NOTE — Telephone Encounter (Signed)
If she doesn't have insurance we can get Lisa Alvarado to help get medication through pharmaceutical company. We usually have samples so can get her more samples until he can get pharmacy assistance.

## 2020-12-30 NOTE — Telephone Encounter (Signed)
Pt returned call. Questioning if there is a generic brand or similar med appropriate as she no longer has insurance. Pt will not be insured "For a while" as husband started new job. States she would be paying out of pocket. States waiting for response before she goes for labs if still necessary. Please advise: [phone]

## 2020-12-30 NOTE — Telephone Encounter (Signed)
Patient advised and would like to receive help with medication assistance. Patient plans to come by the office tomorrow to have labs done.

## 2021-01-02 LAB — RENAL FUNCTION PANEL
Albumin: 4.8 g/dL (ref 3.8–4.9)
BUN/Creatinine Ratio: 16 (ref 9–23)
BUN: 15 mg/dL (ref 6–24)
CO2: 17 mmol/L — ABNORMAL LOW (ref 20–29)
Calcium: 9.3 mg/dL (ref 8.7–10.2)
Chloride: 101 mmol/L (ref 96–106)
Creatinine, Ser: 0.96 mg/dL (ref 0.57–1.00)
Glucose: 142 mg/dL — ABNORMAL HIGH (ref 65–99)
Phosphorus: 3.3 mg/dL (ref 3.0–4.3)
Potassium: 4.5 mmol/L (ref 3.5–5.2)
Sodium: 140 mmol/L (ref 134–144)
eGFR: 68 mL/min/{1.73_m2} (ref >59)

## 2021-01-04 ENCOUNTER — Telehealth: Payer: Self-pay | Admitting: Family Medicine

## 2021-01-04 DIAGNOSIS — E1165 Type 2 diabetes mellitus with hyperglycemia: Secondary | ICD-10-CM

## 2021-01-04 NOTE — Telephone Encounter (Signed)
Doing well with samples of Farxiga. No insurance. Want's to get an medication assistance.

## 2021-01-05 NOTE — Telephone Encounter (Signed)
Pt is calling back states no more medication after tomorrow, will nedd to switch else or get samples FU with pt

## 2021-01-06 NOTE — Telephone Encounter (Signed)
Patient advised samples will be left at front for pick up. KW

## 2021-01-06 NOTE — Telephone Encounter (Signed)
She can have two boxes to the 10mg  and take 1/2 tablet every morning.

## 2021-01-06 NOTE — Telephone Encounter (Signed)
Please advise. We don't have any samples of the 5mg  dose of Farxiga, but we have plenty of the 10mg  dose.

## 2021-01-14 ENCOUNTER — Other Ambulatory Visit: Payer: Self-pay | Admitting: Family Medicine

## 2021-01-14 MED ORDER — METFORMIN HCL ER 500 MG PO TB24: 1000.0000 mg | ORAL_TABLET | Freq: Every day | ORAL | 1 refills | Status: DC

## 2021-01-14 NOTE — Progress Notes (Signed)
CHANGE TO EXTENDED RELEASE PER PREVIOUS OFFICE VISIT NOTE.

## 2021-04-21 ENCOUNTER — Other Ambulatory Visit: Payer: Self-pay

## 2021-04-21 ENCOUNTER — Ambulatory Visit (INDEPENDENT_AMBULATORY_CARE_PROVIDER_SITE_OTHER): Payer: 59 | Admitting: Family Medicine

## 2021-04-21 ENCOUNTER — Encounter: Payer: Self-pay | Admitting: Family Medicine

## 2021-04-21 VITALS — BP 125/81 | Temp 97.5°F | Resp 18 | Wt 177.8 lb

## 2021-04-21 DIAGNOSIS — E7800 Pure hypercholesterolemia, unspecified: Secondary | ICD-10-CM | POA: Diagnosis not present

## 2021-04-21 DIAGNOSIS — I10 Essential (primary) hypertension: Secondary | ICD-10-CM | POA: Diagnosis not present

## 2021-04-21 DIAGNOSIS — E1165 Type 2 diabetes mellitus with hyperglycemia: Secondary | ICD-10-CM | POA: Diagnosis not present

## 2021-04-21 DIAGNOSIS — Z23 Encounter for immunization: Secondary | ICD-10-CM | POA: Diagnosis not present

## 2021-04-21 LAB — POCT UA - MICROALBUMIN: Microalbumin Ur, POC: 20 mg/L

## 2021-04-21 NOTE — Progress Notes (Signed)
Established patient visit   Patient: Lisa Alvarado   DOB: 1960-09-21   60 y.o. Female  MRN: [id number] Visit Date: 04/21/2021  Today's healthcare provider: Mila Merry, MD   Chief Complaint  Patient presents with   Diabetes   Hyperlipidemia   Subjective  -------------------------------------------------------------------------------------------------------------------- HPI  Diabetes Mellitus Type II, follow-up  Lab Results  Component Value Date   HGBA1C 6.7 (A) 12/16/2020   HGBA1C 6.9 (A) 09/17/2020   HGBA1C 8.3 (A) 06/16/2020   Last seen for diabetes 4 months ago.  Management since then includes changing metformin to extended release, also given samples of Farxiga  She reports fair compliance with treatment. Patient has been out of [place] for the past couple of months. She is not having side effects.   Home blood sugar records: fasting range: 135-160  Episodes of hypoglycemia? No    Current insulin regiment: none Most Recent Eye Exam: not UTD  --------------------------------------------------------------------------------------------------- Lipid/Cholesterol, follow-up  Last Lipid Panel: Lab Results  Component Value Date   CHOL 165 03/03/2020   LDLCALC 94 03/03/2020   HDL 32 (L) 03/03/2020   TRIG 226 (H) 03/03/2020    She was last seen for this 14 months ago.  Management since that visit includes no changes.  She reports good compliance with treatment. She is not having side effects.   Symptoms: No appetite changes No foot ulcerations  No chest pain No chest pressure/discomfort  No dyspnea No orthopnea  No fatigue No lower extremity edema  No palpitations No paroxysmal nocturnal dyspnea  No nausea No numbness or tingling of extremity  No polydipsia No polyuria  No speech difficulty No syncope   She is following a Regular diet. Current exercise: none   The 10-year ASCVD risk score Denman George DC Jr., et al., 2013) is:  18.9%  ---------------------------------------------------------------------------------------------------     Medications: Outpatient Medications Prior to Visit  Medication Sig   atorvastatin (LIPITOR) 20 MG tablet TAKE 1 TABLET(20 MG) BY MOUTH DAILY   Boswellia-Glucosamine-Vit D (OSTEO BI-FLEX ONE PER DAY PO) Take by mouth.   escitalopram (LEXAPRO) 10 MG tablet Take 1 tablet (10 mg total) by mouth every other day.   Fluocinolone Acetonide 0.01 % OIL SMARTSIG:1-2 Drop(s) In Ear(s) 1 to 2 Times Daily   Fluocinolone Acetonide Body 0.01 % OIL APPLY TOPICALLY TO BODY TWICE DAILY AS NEEDED. AVOID FACE, GROIN, AXILLA   levonorgestrel (MIRENA) 20 MCG/24HR IUD by Intrauterine route.   metFORMIN (GLUCOPHAGE XR) 500 MG 24 hr tablet Take 2 tablets (1,000 mg total) by mouth daily.   SUMAtriptan (IMITREX) 100 MG tablet Take 1 tablet (100 mg total) by mouth every 2 (two) hours as needed for migraine. No more than 200mg  in 24 hr period   traZODone (DESYREL) 50 MG tablet Take 1-2 tablets (50-100 mg total) by mouth at bedtime as needed for sleep.   dapagliflozin propanediol (FARXIGA) 5 MG TABS tablet Take 1 tablet (5 mg total) by mouth daily before breakfast. (Patient not taking: Reported on 04/21/2021)   meloxicam (MOBIC) 7.5 MG tablet TAKE 1 TABLET(7.5 MG) BY MOUTH DAILY (Patient not taking: Reported on 04/21/2021)   No facility-administered medications prior to visit.    Review of Systems  Constitutional:  Negative for appetite change, chills, fatigue and fever.  Respiratory:  Negative for chest tightness and shortness of breath.   Cardiovascular:  Negative for chest pain and palpitations.  Gastrointestinal:  Negative for abdominal pain, nausea and vomiting.  Neurological:  Negative for dizziness and weakness.  Objective  -------------------------------------------------------------------------------------------------------------------- BP 125/81 (BP Location: Left Arm, Patient Position:  Sitting, Cuff Size: Large)   Temp (!) 97.5 F (36.4 C) (Temporal)   Resp 18   Wt 177 lb 12.8 oz (80.6 kg)   BMI 28.70 kg/m  {Show previous vital signs (optional):23777}  Physical Exam  General appearance:  Well developed, well nourished female, cooperative and in no acute distress Head: Normocephalic, without obvious abnormality, atraumatic Respiratory: Respirations even and unlabored, normal respiratory rate Extremities: All extremities are intact.  Skin: Skin color, texture, turgor normal. No rashes seen  Psych: Appropriate mood and affect. Neurologic: Mental status: Alert, oriented to person, place, and time, thought content appropriate.    Assessment & Plan  ---------------------------------------------------------------------------------------------------------------------- 1. Benign essential HTN Well controlled.  Continue current medications.    2. Type 2 diabetes mellitus with hyperglycemia, without long-term current use of insulin (HCC) Tolerating ER metformin better than IR. Tolerated samples of Farxiga well, but now out for about two months.  - HgB A1c  Will send prescription Farxiga of A1c < 6.5  3. Hypercholesterolemia  - Lipid panel  4. Need for shingles vaccine  No vaccine available today. She plans on getting this and flu vaccine at follow up.   5. Need for tetanus, diphtheria, and acellular pertussis (Tdap) vaccine in patient of adolescent age or older  - Administer Tetanus-diphtheria-acellular pertussis (Tdap) vaccine        The entirety of the information documented in the History of Present Illness, Review of Systems and Physical Exam were personally obtained by me. Portions of this information were initially documented by the CMA and reviewed by me for thoroughness and accuracy.     Mila Merry, MD  [facility] [phone] (phone) [phone] (fax)  [facility]

## 2021-04-22 ENCOUNTER — Other Ambulatory Visit: Payer: Self-pay | Admitting: Family Medicine

## 2021-04-22 ENCOUNTER — Encounter: Payer: Self-pay | Admitting: Family Medicine

## 2021-04-22 ENCOUNTER — Telehealth: Payer: Self-pay

## 2021-04-22 DIAGNOSIS — E1165 Type 2 diabetes mellitus with hyperglycemia: Secondary | ICD-10-CM

## 2021-04-22 LAB — LIPID PANEL
Chol/HDL Ratio: 3.7 ratio (ref 0.0–4.4)
Cholesterol, Total: 144 mg/dL (ref 100–199)
HDL: 39 mg/dL — ABNORMAL LOW (ref >39)
LDL Chol Calc (NIH): 81 mg/dL (ref 0–99)
Triglycerides: 133 mg/dL (ref 0–149)
VLDL Cholesterol Cal: 24 mg/dL (ref 5–40)

## 2021-04-22 LAB — HEMOGLOBIN A1C
Est. average glucose Bld gHb Est-mCnc: 146 mg/dL
Hgb A1c MFr Bld: 6.7 % — ABNORMAL HIGH (ref 4.8–5.6)

## 2021-04-22 IMAGING — MG DIGITAL SCREENING BILAT W/ TOMO W/ CAD
8 series · 8 of 24 positions shown · non-contrast
Comparison: Previous exam(s).

CLINICAL DATA: Screening.

EXAM:
DIGITAL SCREENING BILATERAL MAMMOGRAM WITH TOMO AND CAD

[L MLO synth-2D]
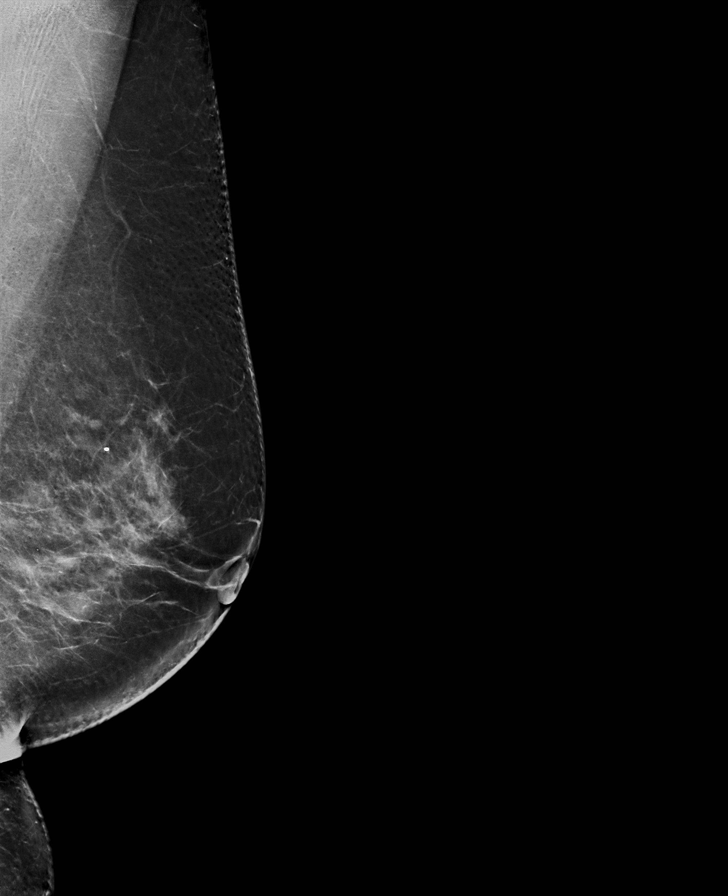

[R MLO synth-2D]
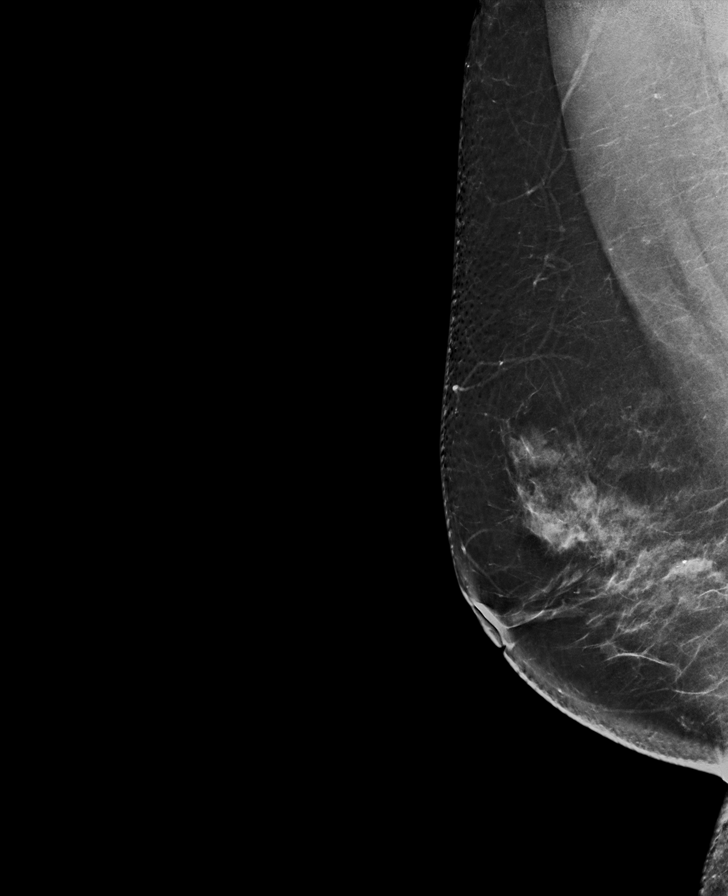

[R CC synth-2D]
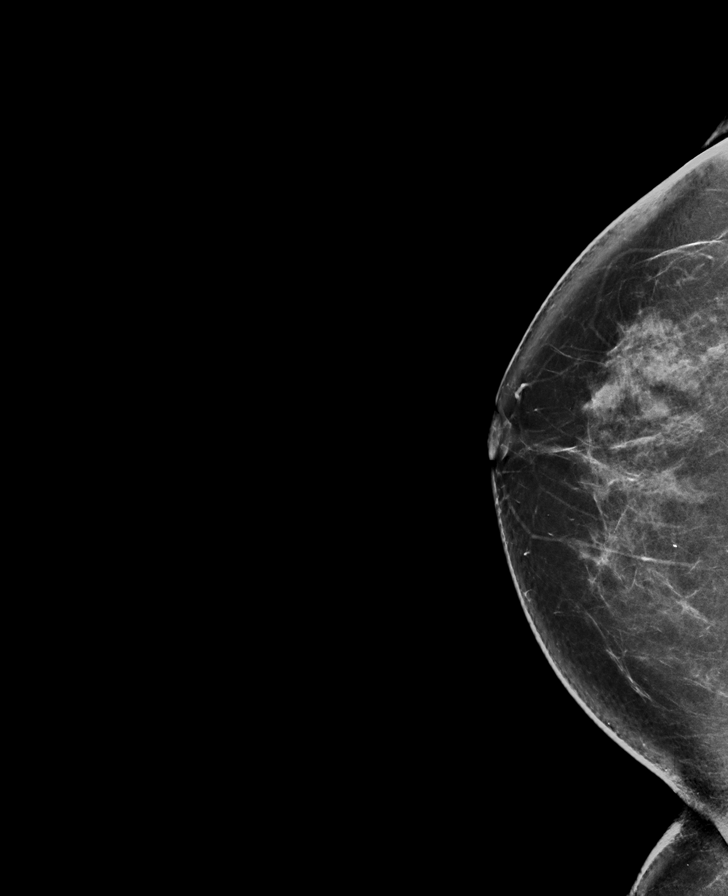

[L CC synth-2D]
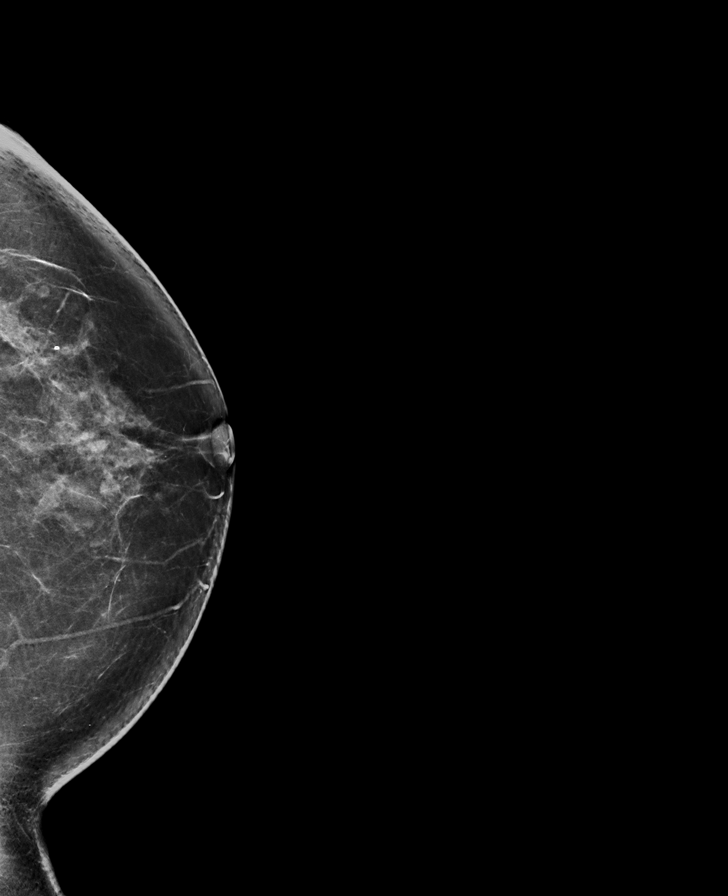

[L MLO tomo · tomo slice 44/87.0]
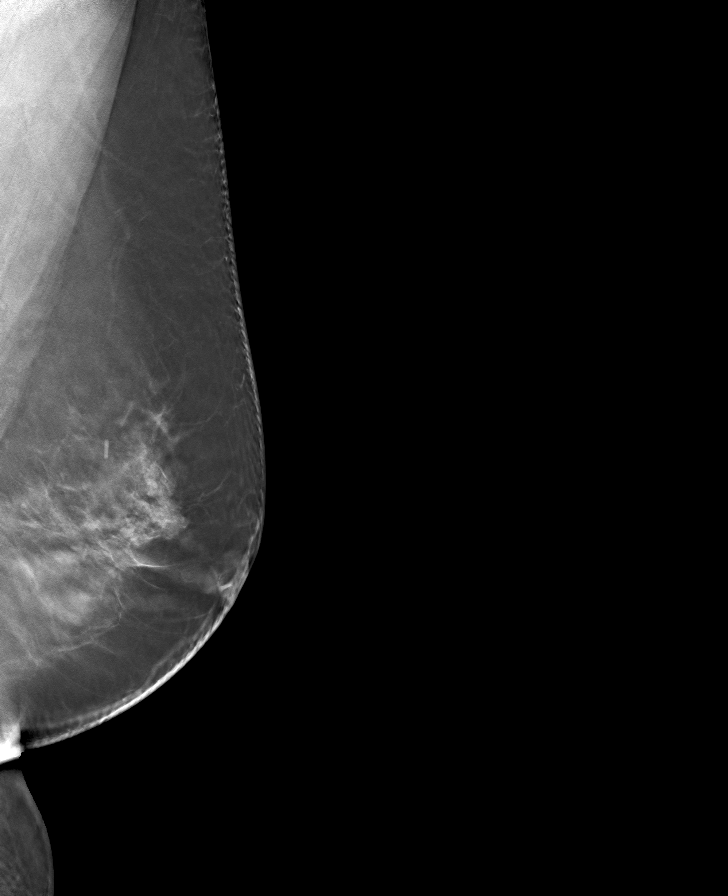

[L CC tomo · tomo slice 41/82.0]
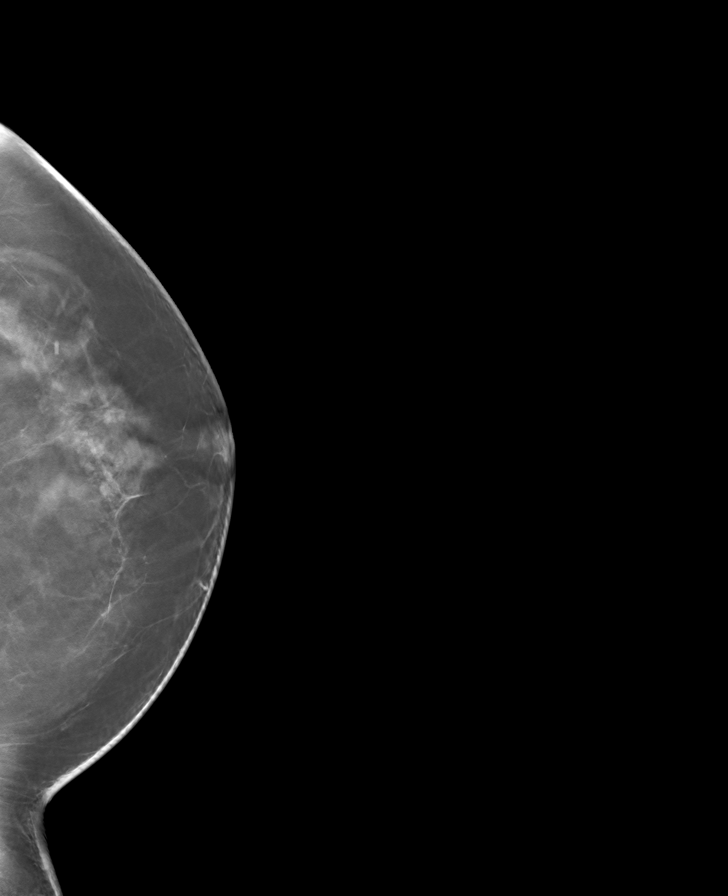

[R CC tomo · tomo slice 43/85.0]
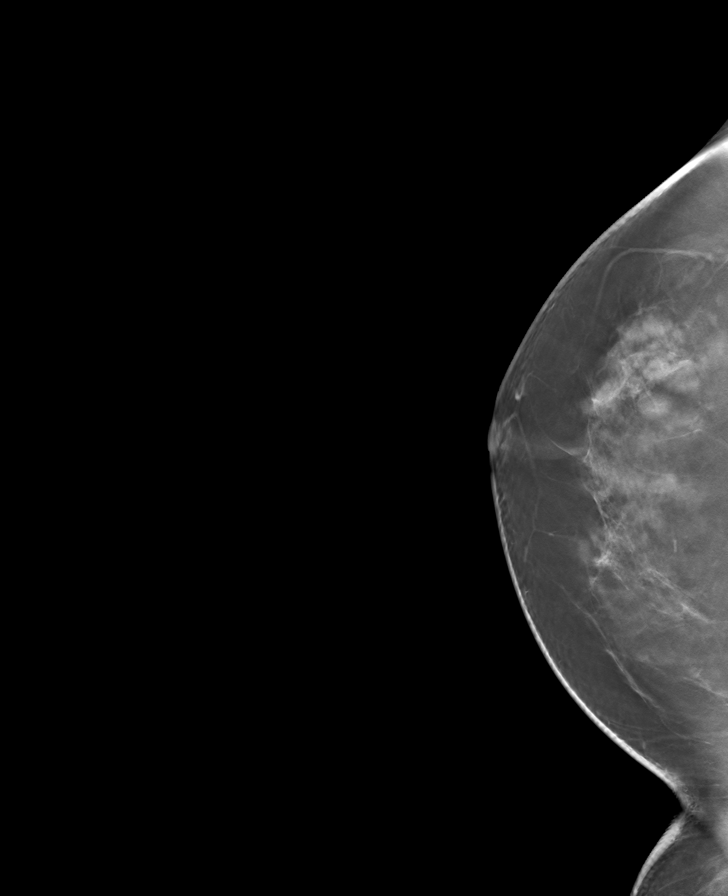

[R MLO tomo · tomo slice 45/90.0]
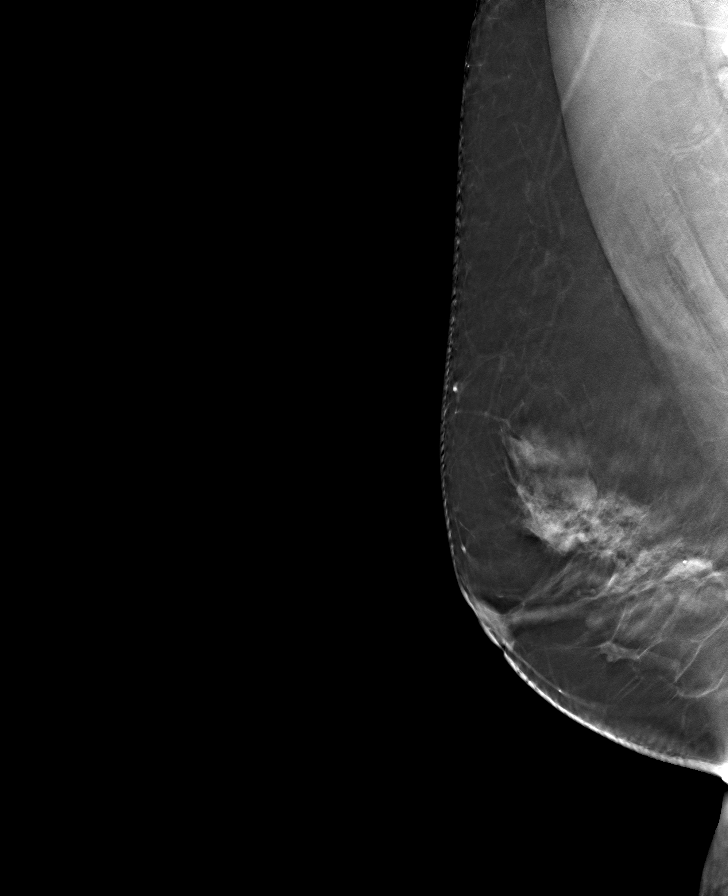

[8 of 24 positions shown; findings below may reference images not displayed]

ACR Breast Density Category b: There are scattered areas of
fibroglandular density.
FINDINGS: In the right breast, a possible mass and possible asymmetry warrant
further evaluation. This possible mass and possible asymmetry or
both seen within the outer RIGHT breast, craniocaudal slice 54,
probable correlate within the upper RIGHT breast on the MLO view.

In the left breast, no findings suspicious for malignancy. Images
were processed with CAD.
IMPRESSION: Further evaluation is suggested for possible mass and possible
asymmetry in the right breast.

RECOMMENDATION:
Diagnostic mammogram and possibly ultrasound of the right breast.
(Code:[id number])

The patient will be contacted regarding the findings, and additional
imaging will be scheduled.

BI-RADS CATEGORY  0: Incomplete. Need additional imaging evaluation
and/or prior mammograms for comparison.

## 2021-04-22 MED ORDER — DAPAGLIFLOZIN PROPANEDIOL 5 MG PO TABS: 5.0000 mg | ORAL_TABLET | Freq: Every day | ORAL | 3 refills | Status: DC

## 2021-04-22 NOTE — Telephone Encounter (Signed)
Patient called in says insurance wont cover Lisa Alvarado presribed but will cover Jardience. Please call back

## 2021-04-22 NOTE — Telephone Encounter (Signed)
Copied from CRM [id number]. Topic: General - Call Back - No Documentation >> Apr 22, 2021 12:39 PM Randol Kern wrote: Reason for CRM: Pt is requesting a call back regarding questions about her [place]. Says the pharmacy does not have a Rx for it and she wants to know if she is missing this or no longer needs it.  Best contact: [phone]

## 2021-04-23 ENCOUNTER — Encounter: Payer: Self-pay | Admitting: Family Medicine

## 2021-04-23 MED ORDER — EMPAGLIFLOZIN 10 MG PO TABS: 10.0000 mg | ORAL_TABLET | Freq: Every day | ORAL | 3 refills | Status: DC

## 2021-04-23 NOTE — Addendum Note (Signed)
Addended by: Malva Limes on: 04/23/2021 07:39 AM   Modules accepted: Orders

## 2021-04-24 NOTE — Telephone Encounter (Signed)
Pt called and stated that [facility] will not release RX to pt without authorization from the Doctor/ please advise

## 2021-05-23 ENCOUNTER — Other Ambulatory Visit: Payer: Self-pay | Admitting: Family Medicine

## 2021-08-10 ENCOUNTER — Other Ambulatory Visit: Payer: Self-pay | Admitting: Family Medicine

## 2021-08-29 ENCOUNTER — Encounter: Payer: Self-pay | Admitting: Family Medicine

## 2021-08-29 DIAGNOSIS — E1165 Type 2 diabetes mellitus with hyperglycemia: Secondary | ICD-10-CM

## 2021-08-31 MED ORDER — METFORMIN HCL ER 500 MG PO TB24: 1000.0000 mg | ORAL_TABLET | Freq: Every day | ORAL | 0 refills | Status: DC

## 2021-08-31 MED ORDER — EMPAGLIFLOZIN 10 MG PO TABS: 10.0000 mg | ORAL_TABLET | Freq: Every day | ORAL | 0 refills | Status: DC

## 2021-09-03 ENCOUNTER — Encounter: Payer: Self-pay | Admitting: Family Medicine

## 2021-09-14 ENCOUNTER — Other Ambulatory Visit: Payer: Self-pay | Admitting: Family Medicine

## 2021-09-17 ENCOUNTER — Ambulatory Visit: Payer: 59 | Admitting: Family Medicine

## 2021-09-17 ENCOUNTER — Encounter: Payer: Self-pay | Admitting: Family Medicine

## 2021-09-17 ENCOUNTER — Other Ambulatory Visit: Payer: Self-pay

## 2021-09-17 VITALS — BP 118/89 | HR 87 | Temp 98.3°F | Resp 16 | Ht 66.0 in | Wt 171.7 lb

## 2021-09-17 DIAGNOSIS — E1159 Type 2 diabetes mellitus with other circulatory complications: Secondary | ICD-10-CM

## 2021-09-17 DIAGNOSIS — F411 Generalized anxiety disorder: Secondary | ICD-10-CM | POA: Diagnosis not present

## 2021-09-17 DIAGNOSIS — E1169 Type 2 diabetes mellitus with other specified complication: Secondary | ICD-10-CM

## 2021-09-17 DIAGNOSIS — G4700 Insomnia, unspecified: Secondary | ICD-10-CM

## 2021-09-17 DIAGNOSIS — E663 Overweight: Secondary | ICD-10-CM

## 2021-09-17 DIAGNOSIS — E785 Hyperlipidemia, unspecified: Secondary | ICD-10-CM

## 2021-09-17 DIAGNOSIS — Z1231 Encounter for screening mammogram for malignant neoplasm of breast: Secondary | ICD-10-CM

## 2021-09-17 DIAGNOSIS — F17200 Nicotine dependence, unspecified, uncomplicated: Secondary | ICD-10-CM

## 2021-09-17 DIAGNOSIS — I152 Hypertension secondary to endocrine disorders: Secondary | ICD-10-CM

## 2021-09-17 MED ORDER — EMPAGLIFLOZIN 10 MG PO TABS
10.0000 mg | ORAL_TABLET | Freq: Every day | ORAL | 1 refills | Status: DC
Start: 2021-09-17 — End: 2022-04-12

## 2021-09-17 MED ORDER — ATORVASTATIN CALCIUM 20 MG PO TABS: ORAL_TABLET | ORAL | 1 refills | Status: DC

## 2021-09-17 NOTE — Assessment & Plan Note (Signed)
Well controlled Continue diet control Recheck metabolic panel F/u in 6 months  

## 2021-09-17 NOTE — Assessment & Plan Note (Signed)
Discussion regarding the harms of tobacco use, the benefits of cessation, and methods of cessation Patient is currently precontemplative  Will reassess at next visit  

## 2021-09-17 NOTE — Assessment & Plan Note (Signed)
Well controlled  No longer on SSRI Doing well with support animal

## 2021-09-17 NOTE — Assessment & Plan Note (Signed)
Previously well controlled Continue statin Repeat FLP and CMP at next visit 

## 2021-09-17 NOTE — Assessment & Plan Note (Signed)
Discussed importance of healthy weight management Discussed diet and exercise  

## 2021-09-17 NOTE — Progress Notes (Signed)
Established patient visit   Patient: Lisa Alvarado   DOB: 02/09/1961   61 y.o. Female  MRN: [id number] Visit Date: 09/17/2021  Today's healthcare provider: Lavon Paganini, MD   Chief Complaint  Patient presents with   Follow-up    DM and HTN   Subjective    HPI HPI     Follow-up    Additional comments: DM and HTN      Last edited by Doristine Devoid, CMA on 09/17/2021  4:05 PM.      Diabetes Mellitus Type II, Follow-up  Lab Results  Component Value Date   HGBA1C 6.7 (H) 04/21/2021   HGBA1C 6.7 (A) 12/16/2020   HGBA1C 6.9 (A) 09/17/2020   Wt Readings from Last 3 Encounters:  09/17/21 171 lb 11.2 oz (77.9 kg)  04/21/21 177 lb 12.8 oz (80.6 kg)  12/16/20 189 lb 9.6 oz (86 kg)   Last seen for diabetes 4 months ago.  Management since then includes Continue current medications. (Metformin and Jardiance.) patient reports that she has been off the Jardiance for 2 weeks because she needed to come to see [place] first. She reports excellent compliance with treatment. She is not having side effects. Symptoms: Yes fatigue No foot ulcerations  No appetite changes No nausea  No paresthesia of the feet  No polydipsia  No polyuria No visual disturbances   No vomiting     Home blood sugar records: fasting range: 124's-140's in the last couple of weeks  Episodes of hypoglycemia? No  Most Recent Eye Exam: NUTD Current exercise: walking Current diet habits: well balanced  Pertinent Labs: Lab Results  Component Value Date   CHOL 144 04/21/2021   HDL 39 (L) 04/21/2021   LDLCALC 81 04/21/2021   TRIG 133 04/21/2021   CHOLHDL 3.7 04/21/2021   Lab Results  Component Value Date   NA 140 01/01/2021   K 4.5 01/01/2021   CREATININE 0.96 01/01/2021   EGFR 68 01/01/2021   MICROALBUR 20 04/21/2021     ---------------------------------------------------------------------------------------------------  Hypertension, follow-up  BP Readings from Last 3 Encounters:   09/17/21 118/89  04/21/21 125/81  12/16/20 131/84   Wt Readings from Last 3 Encounters:  09/17/21 171 lb 11.2 oz (77.9 kg)  04/21/21 177 lb 12.8 oz (80.6 kg)  12/16/20 189 lb 9.6 oz (86 kg)     She was last seen for hypertension 4 months ago.  BP at that visit was 125/81. Management since that visit includes continue current medications.  She reports excellent compliance with treatment. She is not having side effects.    Outside blood pressures are not being checked. Symptoms: No chest pain No chest pressure  No palpitations No syncope  No dyspnea No orthopnea  No paroxysmal nocturnal dyspnea Yes lower extremity edema-is better   Pertinent labs: Lab Results  Component Value Date   CHOL 144 04/21/2021   HDL 39 (L) 04/21/2021   LDLCALC 81 04/21/2021   TRIG 133 04/21/2021   CHOLHDL 3.7 04/21/2021   Lab Results  Component Value Date   NA 140 01/01/2021   K 4.5 01/01/2021   CREATININE 0.96 01/01/2021   EGFR 68 01/01/2021   GLUCOSE 142 (H) 01/01/2021   TSH 2.350 03/03/2020     The 10-year ASCVD risk score (Arnett DK, et al., 2019) is: 14.3%   ---------------------------------------------------------------------------------------------------  Patient reports that she has been out of her Atorvastatin. Problems with insurance. Medications: Outpatient Medications Prior to Visit  Medication Sig   Boswellia-Glucosamine-Vit  D (OSTEO BI-FLEX ONE PER DAY PO) Take by mouth.   Fluocinolone Acetonide 0.01 % OIL SMARTSIG:1-2 Drop(s) In Ear(s) 1 to 2 Times Daily   Fluocinolone Acetonide Body 0.01 % OIL APPLY TOPICALLY TO BODY TWICE DAILY AS NEEDED. AVOID FACE, GROIN, AXILLA   levonorgestrel (MIRENA) 20 MCG/24HR IUD by Intrauterine route.   metFORMIN (GLUCOPHAGE-XR) 500 MG 24 hr tablet TAKE 2 TABLETS(1000 MG) BY MOUTH DAILY   SUMAtriptan (IMITREX) 100 MG tablet Take 1 tablet (100 mg total) by mouth every 2 (two) hours as needed for migraine. No more than 225m in 24 hr period    [DISCONTINUED] empagliflozin (JARDIANCE) 10 MG TABS tablet Take 1 tablet (10 mg total) by mouth daily before breakfast. Please schedule an office visit before anymore refills.   [DISCONTINUED] traZODone (DESYREL) 50 MG tablet Take 1-2 tablets (50-100 mg total) by mouth at bedtime as needed for sleep.   [DISCONTINUED] atorvastatin (LIPITOR) 20 MG tablet TAKE 1 TABLET(20 MG) BY MOUTH DAILY   [DISCONTINUED] escitalopram (LEXAPRO) 10 MG tablet Take 1 tablet (10 mg total) by mouth every other day. (Patient not taking: Reported on 09/17/2021)   [DISCONTINUED] meloxicam (MOBIC) 7.5 MG tablet TAKE 1 TABLET(7.5 MG) BY MOUTH DAILY (Patient not taking: Reported on 09/17/2021)   No facility-administered medications prior to visit.    Review of Systems per HPI     Objective    BP 118/89 (BP Location: Left Arm, Patient Position: Sitting, Cuff Size: Large)    Pulse 87    Temp 98.3 F (36.8 C) (Oral)    Resp 16    Ht '5\' 6"'  (1.676 m)    Wt 171 lb 11.2 oz (77.9 kg)    BMI 27.71 kg/m  {Show previous vital signs (optional):23777}  Physical Exam Vitals reviewed.  Constitutional:      General: She is not in acute distress.    Appearance: Normal appearance. She is well-developed. She is not diaphoretic.  HENT:     Head: Normocephalic and atraumatic.  Eyes:     General: No scleral icterus.    Conjunctiva/sclera: Conjunctivae normal.  Neck:     Thyroid: No thyromegaly.  Cardiovascular:     Rate and Rhythm: Normal rate and regular rhythm.     Pulses: Normal pulses.     Heart sounds: Normal heart sounds. No murmur heard. Pulmonary:     Effort: Pulmonary effort is normal. No respiratory distress.     Breath sounds: Normal breath sounds. No wheezing, rhonchi or rales.  Musculoskeletal:     Cervical back: Neck supple.     Right lower leg: No edema.     Left lower leg: No edema.  Lymphadenopathy:     Cervical: No cervical adenopathy.  Skin:    General: Skin is warm and dry.     Findings: No rash.   Neurological:     Mental Status: She is alert and oriented to person, place, and time. Mental status is at baseline.  Psychiatric:        Mood and Affect: Mood normal.        Behavior: Behavior normal.     Diabetic Foot Exam - Simple   Simple Foot Form Diabetic Foot exam was performed with the following findings: Yes 09/17/2021  4:39 PM  Visual Inspection No deformities, no ulcerations, no other skin breakdown bilaterally: Yes Sensation Testing Intact to touch and monofilament testing bilaterally: Yes Pulse Check Posterior Tibialis and Dorsalis pulse intact bilaterally: Yes Comments      No results  found for any visits on 09/17/21.  Assessment & Plan     Problem List Items Addressed This Visit       Cardiovascular and Mediastinum   Hypertension associated with diabetes ([place])    Well controlled Continue diet control Recheck metabolic panel F/u in 6 months       Relevant Medications   atorvastatin (LIPITOR) 20 MG tablet   empagliflozin (JARDIANCE) 10 MG TABS tablet   Other Relevant Orders   Comprehensive metabolic panel     Endocrine   Hyperlipidemia associated with type 2 diabetes mellitus ([place])    Previously well controlled Continue statin Repeat FLP and CMP at next visit      Relevant Medications   atorvastatin (LIPITOR) 20 MG tablet   empagliflozin (JARDIANCE) 10 MG TABS tablet   Other Relevant Orders   Comprehensive metabolic panel   Type 2 diabetes mellitus with hyperglycemia, without long-term current use of insulin ([place]) - Primary    Well controlled with last A1c 6.7 Repeat today Continue current medications UTD on vaccines,needs eye exam, foot exam today On Statin Discussed diet and exercise F/u in 6 months       Relevant Medications   atorvastatin (LIPITOR) 20 MG tablet   empagliflozin (JARDIANCE) 10 MG TABS tablet     Other   GAD (generalized anxiety disorder)    Well controlled  No longer on SSRI Doing well with support animal       Insomnia    Stable No longer on trazodone      Compulsive tobacco user syndrome    Discussion regarding the harms of tobacco use, the benefits of cessation, and methods of cessation Patient is currently precontemplative  Will reassess at next visit       Overweight    Discussed importance of healthy weight management Discussed diet and exercise       Other Visit Diagnoses     Screening mammogram for breast cancer       Relevant Orders   MM 3D SCREEN BREAST BILATERAL        Return in about 6 months (around 03/17/2022) for CPE.      I, Lavon Paganini, MD, have reviewed all documentation for this visit. The documentation on 09/17/21 for the exam, diagnosis, procedures, and orders are all accurate and complete.   Geovanna Simko, Dionne Bucy, MD, MPH [facility]

## 2021-09-17 NOTE — Assessment & Plan Note (Signed)
Stable No longer on trazodone

## 2021-09-17 NOTE — Assessment & Plan Note (Signed)
Well controlled with last A1c 6.7 Repeat today Continue current medications UTD on vaccines,needs eye exam, foot exam today On Statin Discussed diet and exercise F/u in 6 months

## 2021-09-25 LAB — HEMOGLOBIN A1C
Est. average glucose Bld gHb Est-mCnc: 134 mg/dL
Hgb A1c MFr Bld: 6.3 % — ABNORMAL HIGH (ref 4.8–5.6)

## 2021-09-25 LAB — COMPREHENSIVE METABOLIC PANEL
ALT: 17 IU/L (ref 0–32)
AST: 18 IU/L (ref 0–40)
Albumin/Globulin Ratio: 2.1 (ref 1.2–2.2)
Albumin: 5.1 g/dL — ABNORMAL HIGH (ref 3.8–4.9)
Alkaline Phosphatase: 101 IU/L (ref 44–121)
BUN/Creatinine Ratio: 12 (ref 12–28)
BUN: 11 mg/dL (ref 8–27)
Bilirubin Total: 0.7 mg/dL (ref 0.0–1.2)
CO2: 20 mmol/L (ref 20–29)
Calcium: 9.4 mg/dL (ref 8.7–10.3)
Chloride: 104 mmol/L (ref 96–106)
Creatinine, Ser: 0.92 mg/dL (ref 0.57–1.00)
Globulin, Total: 2.4 g/dL (ref 1.5–4.5)
Glucose: 121 mg/dL — ABNORMAL HIGH (ref 70–99)
Potassium: 4.1 mmol/L (ref 3.5–5.2)
Sodium: 143 mmol/L (ref 134–144)
Total Protein: 7.5 g/dL (ref 6.0–8.5)
eGFR: 71 mL/min/{1.73_m2} (ref >59)

## 2021-09-28 ENCOUNTER — Telehealth: Payer: Self-pay

## 2021-09-28 NOTE — Telephone Encounter (Signed)
Patient is asking if Dr. B can take her IUD? Patient asked if Dr. B can take her husband as a new patient?

## 2021-09-28 NOTE — Telephone Encounter (Signed)
I can remove an IUD (as long as strings are visible, which we will find out when we look). Any appt (20 min) will work for this.  I am taking family members as patients.

## 2021-09-29 NOTE — Telephone Encounter (Signed)
Patient advised and scheduled.  

## 2021-10-07 NOTE — Progress Notes (Signed)
Established patient visit   Patient: Lisa Alvarado   DOB: 20-Mar-1961   60 y.o. Female  MRN: [id number] Visit Date: 10/09/2021  Today's healthcare provider: Shirlee Latch, MD   Chief Complaint  Patient presents with   Contraception   I,Sulibeya S Dimas,acting as a scribe for Shirlee Latch, MD.,have documented all relevant documentation on the behalf of Shirlee Latch, MD,as directed by  Shirlee Latch, MD while in the presence of Shirlee Latch, MD.  Subjective    HPI  Patient is a 61 year old female who presents to have her IUD removed. Has been in for 5-7 years.  No problems, but post-menopausal.  Also due or pap  Medications: Outpatient Medications Prior to Visit  Medication Sig   atorvastatin (LIPITOR) 20 MG tablet TAKE 1 TABLET(20 MG) BY MOUTH DAILY   Boswellia-Glucosamine-Vit D (OSTEO BI-FLEX ONE PER DAY PO) Take by mouth.   empagliflozin (JARDIANCE) 10 MG TABS tablet Take 1 tablet (10 mg total) by mouth daily before breakfast. Please schedule an office visit before anymore refills.   Fluocinolone Acetonide 0.01 % OIL SMARTSIG:1-2 Drop(s) In Ear(s) 1 to 2 Times Daily   Fluocinolone Acetonide Body 0.01 % OIL APPLY TOPICALLY TO BODY TWICE DAILY AS NEEDED. AVOID FACE, GROIN, AXILLA   levonorgestrel (MIRENA) 20 MCG/24HR IUD by Intrauterine route.   metFORMIN (GLUCOPHAGE-XR) 500 MG 24 hr tablet TAKE 2 TABLETS(1000 MG) BY MOUTH DAILY   SUMAtriptan (IMITREX) 100 MG tablet Take 1 tablet (100 mg total) by mouth every 2 (two) hours as needed for migraine. No more than 200mg  in 24 hr period   No facility-administered medications prior to visit.    Review of Systems      Objective    BP 112/84 (BP Location: Left Arm, Patient Position: Sitting, Cuff Size: Large)    Pulse 89    Temp 98 F (36.7 C) (Temporal)    Resp 16    Wt 170 lb (77.1 kg)    SpO2 99%    BMI 27.44 kg/m  BP Readings from Last 3 Encounters:  10/09/21 112/84  09/17/21 118/89  04/21/21  125/81   Wt Readings from Last 3 Encounters:  10/09/21 170 lb (77.1 kg)  09/17/21 171 lb 11.2 oz (77.9 kg)  04/21/21 177 lb 12.8 oz (80.6 kg)      Physical Exam Vitals reviewed.  Constitutional:      General: She is not in acute distress.    Appearance: She is well-developed.  HENT:     Head: Normocephalic and atraumatic.  Eyes:     General: No scleral icterus.    Conjunctiva/sclera: Conjunctivae normal.  Cardiovascular:     Rate and Rhythm: Normal rate and regular rhythm.  Pulmonary:     Effort: Pulmonary effort is normal. No respiratory distress.  Genitourinary:    Comments: GYN:  External genitalia within normal limits.  Vaginal mucosa pink, moist, normal rugae.  Nonfriable cervix without lesions, no discharge or bleeding noted on speculum exam.  IUD strings visible from external os Skin:    General: Skin is warm and dry.     Findings: No rash.  Neurological:     Mental Status: She is alert and oriented to person, place, and time.  Psychiatric:        Behavior: Behavior normal.      No results found for any visits on 10/09/21.  Assessment & Plan     Problem List Items Addressed This Visit   None Visit Diagnoses  Encounter for IUD removal    -  Primary   Screening for cervical cancer       Relevant Orders   Cytology - PAP        IUD removal: Procedure note Patient was given informed consent, signed copy in the chart. Appropriate time out was taken. IUD strings easily visualized from external os.  Strings grasped with ring forceps and with patient giving increased abd pressure, strings pulled and IUD easily removed intact.  Patient tolerated procedure well.  Discussed return precautions.    Return if symptoms worsen or fail to improve.      I, Shirlee Latch, MD, have reviewed all documentation for this visit. The documentation on 10/09/21 for the exam, diagnosis, procedures, and orders are all accurate and complete.   Zailen Albarran, Marzella Schlein, MD,  MPH [facility]

## 2021-10-09 ENCOUNTER — Encounter: Payer: Self-pay | Admitting: Family Medicine

## 2021-10-09 ENCOUNTER — Other Ambulatory Visit (HOSPITAL_COMMUNITY)
Admission: RE | Admit: 2021-10-09 | Discharge: 2021-10-09 | Disposition: A | Discharge status: Home / Self Care | Payer: 59 | Source: Ambulatory Visit | Attending: Family Medicine | Admitting: Family Medicine

## 2021-10-09 ENCOUNTER — Other Ambulatory Visit: Payer: Self-pay

## 2021-10-09 ENCOUNTER — Ambulatory Visit (INDEPENDENT_AMBULATORY_CARE_PROVIDER_SITE_OTHER): Payer: 59 | Admitting: Family Medicine

## 2021-10-09 VITALS — BP 112/84 | HR 89 | Temp 98.0°F | Resp 16 | Wt 170.0 lb

## 2021-10-09 DIAGNOSIS — Z30432 Encounter for removal of intrauterine contraceptive device: Secondary | ICD-10-CM

## 2021-10-09 DIAGNOSIS — Z124 Encounter for screening for malignant neoplasm of cervix: Secondary | ICD-10-CM

## 2021-10-13 LAB — CYTOLOGY - PAP
Comment: NEGATIVE
Diagnosis: NEGATIVE
High risk HPV: NEGATIVE

## 2021-10-30 ENCOUNTER — Other Ambulatory Visit: Payer: Self-pay

## 2021-10-30 ENCOUNTER — Ambulatory Visit
Admission: RE | Admit: 2021-10-30 | Discharge: 2021-10-30 | Disposition: A | Discharge status: Home / Self Care | Payer: 59 | Source: Ambulatory Visit | Attending: Family Medicine | Admitting: Family Medicine

## 2021-10-30 DIAGNOSIS — Z1231 Encounter for screening mammogram for malignant neoplasm of breast: Secondary | ICD-10-CM | POA: Diagnosis present

## 2022-03-13 ENCOUNTER — Other Ambulatory Visit: Payer: Self-pay | Admitting: Family Medicine

## 2022-03-15 ENCOUNTER — Other Ambulatory Visit: Payer: Self-pay | Admitting: Family Medicine

## 2022-03-15 NOTE — Telephone Encounter (Signed)
Medication Refill - Medication: atorvastatin (LIPITOR) 20 MG tablet [[id number]]   Has the patient contacted their pharmacy? Yes.   (Agent: If no, request that the patient contact the pharmacy for the refill. If patient does not wish to contact the pharmacy document the reason why and proceed with request.) (Agent: If yes, when and what did the pharmacy advise?)   Preferred Pharmacy (with phone number or street name):  [id number] [place], [place] - [address] AT [facility] OF [address]  [address] [place] [address]  Phone: [phone] Fax: [phone]  Hours: Not open 24 hours   Has the patient been seen for an appointment in the last year OR does the patient have an upcoming appointment? Yes.    Agent: Please be advised that RX refills may take up to 3 business days. We ask that you follow-up with your pharmacy.

## 2022-03-15 NOTE — Telephone Encounter (Signed)
Requested Prescriptions  Pending Prescriptions Disp Refills  . atorvastatin (LIPITOR) 20 MG tablet [Pharmacy Med Name: ATORVASTATIN 20MG  TABLETS] 90 tablet 0    Sig: TAKE 1 TABLET(20 MG) BY MOUTH DAILY     Cardiovascular:  Antilipid - Statins Failed - 03/13/2022  7:13 AM      Failed - Lipid Panel in normal range within the last 12 months    Cholesterol, Total  Date Value Ref Range Status  04/21/2021 144 100 - 199 mg/dL Final   LDL Chol Calc (NIH)  Date Value Ref Range Status  04/21/2021 81 0 - 99 mg/dL Final   HDL  Date Value Ref Range Status  04/21/2021 39 (L) >39 mg/dL Final   Triglycerides  Date Value Ref Range Status  04/21/2021 133 0 - 149 mg/dL Final         Passed - Patient is not pregnant      Passed - Valid encounter within last 12 months    Recent Outpatient Visits          5 months ago Encounter for IUD removal   06/21/2021, [facility], MD   5 months ago Type 2 diabetes mellitus with other specified complication, without long-term current use of insulin [facility])   [facility] [place], [place], [place]   10 months ago Benign essential HTN   [facility], MD   1 year ago Type 2 diabetes mellitus with hyperglycemia, without long-term current use of insulin [facility])   [facility], MD   1 year ago Benign essential HTN   [facility], [place], Alessandra Bevels      Future Appointments            In 4 weeks Bacigalupo, [place], MD [facility], [facility]

## 2022-03-16 NOTE — Telephone Encounter (Signed)
rx was sent to pharmacy by another triage RN on 03/15/22 #90/0 Requested Prescriptions  Pending Prescriptions Disp Refills  . atorvastatin (LIPITOR) 20 MG tablet 90 tablet 1    Sig: TAKE 1 TABLET(20 MG) BY MOUTH DAILY     Cardiovascular:  Antilipid - Statins Failed - 03/15/2022 12:58 PM      Failed - Lipid Panel in normal range within the last 12 months    Cholesterol, Total  Date Value Ref Range Status  04/21/2021 144 100 - 199 mg/dL Final   LDL Chol Calc (NIH)  Date Value Ref Range Status  04/21/2021 81 0 - 99 mg/dL Final   HDL  Date Value Ref Range Status  04/21/2021 39 (L) >39 mg/dL Final   Triglycerides  Date Value Ref Range Status  04/21/2021 133 0 - 149 mg/dL Final         Passed - Patient is not pregnant      Passed - Valid encounter within last 12 months    Recent Outpatient Visits          5 months ago Encounter for IUD removal   [facility], Marzella Schlein, MD   6 months ago Type 2 diabetes mellitus with other specified complication, without long-term current use of insulin [facility])   [facility], Marzella Schlein, MD   10 months ago Benign essential HTN   [facility] Malva Limes, MD   1 year ago Type 2 diabetes mellitus with hyperglycemia, without long-term current use of insulin [facility])   [facility] Malva Limes, MD   1 year ago Benign essential HTN   [facility], Alessandra Bevels, [place]      Future Appointments            In 3 weeks Bacigalupo, Marzella Schlein, MD [facility], [facility]

## 2022-04-12 ENCOUNTER — Encounter: Payer: 59 | Admitting: Family Medicine

## 2022-04-12 ENCOUNTER — Ambulatory Visit (INDEPENDENT_AMBULATORY_CARE_PROVIDER_SITE_OTHER): Payer: 59 | Admitting: Family Medicine

## 2022-04-12 ENCOUNTER — Encounter: Payer: Self-pay | Admitting: Family Medicine

## 2022-04-12 VITALS — BP 135/85 | HR 88 | Temp 98.3°F | Resp 16 | Ht 66.0 in | Wt 170.9 lb

## 2022-04-12 DIAGNOSIS — E785 Hyperlipidemia, unspecified: Secondary | ICD-10-CM

## 2022-04-12 DIAGNOSIS — E1165 Type 2 diabetes mellitus with hyperglycemia: Secondary | ICD-10-CM

## 2022-04-12 DIAGNOSIS — E538 Deficiency of other specified B group vitamins: Secondary | ICD-10-CM

## 2022-04-12 DIAGNOSIS — G43709 Chronic migraine without aura, not intractable, without status migrainosus: Secondary | ICD-10-CM

## 2022-04-12 DIAGNOSIS — R5383 Other fatigue: Secondary | ICD-10-CM | POA: Diagnosis not present

## 2022-04-12 DIAGNOSIS — I152 Hypertension secondary to endocrine disorders: Secondary | ICD-10-CM

## 2022-04-12 DIAGNOSIS — E1159 Type 2 diabetes mellitus with other circulatory complications: Secondary | ICD-10-CM

## 2022-04-12 DIAGNOSIS — E663 Overweight: Secondary | ICD-10-CM

## 2022-04-12 DIAGNOSIS — Z1211 Encounter for screening for malignant neoplasm of colon: Secondary | ICD-10-CM

## 2022-04-12 DIAGNOSIS — E1169 Type 2 diabetes mellitus with other specified complication: Secondary | ICD-10-CM

## 2022-04-12 DIAGNOSIS — Z0000 Encounter for general adult medical examination without abnormal findings: Secondary | ICD-10-CM

## 2022-04-12 MED ORDER — EMPAGLIFLOZIN 10 MG PO TABS: 10.0000 mg | ORAL_TABLET | Freq: Every day | ORAL | 3 refills | Status: DC

## 2022-04-12 MED ORDER — ATORVASTATIN CALCIUM 20 MG PO TABS: 20.0000 mg | ORAL_TABLET | Freq: Every day | ORAL | 3 refills | Status: DC

## 2022-04-12 MED ORDER — SUMATRIPTAN SUCCINATE 100 MG PO TABS: 100.0000 mg | ORAL_TABLET | ORAL | 5 refills | Status: DC | PRN
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
## 2022-04-12 NOTE — Patient Instructions (Signed)
It was great to see you!  Our plans for today:  - Someone will call you about appointments with physical therapy and colonoscopy - We are checking some labs today, we will release these results to your MyChart.  Take care and seek immediate care sooner if you develop any concerns.   Dr. Linwood Dibbles   Things to do to keep yourself healthy  - Exercise at least 30-45 minutes a day, 3-4 days a week.  - Eat a low-fat diet with lots of fruits and vegetables, up to 7-9 servings per day.  - Seatbelts can save your life. Wear them always.  - Smoke detectors on every level of your home, check batteries every year.  - Eye Doctor - have an eye exam every 1-2 years  - Safe sex - if you may be exposed to STDs, use a condom.  - Alcohol -  If you drink, do it moderately, less than 2 drinks per day.  - Health Care Power of Attorney. Choose someone to speak for you if you are not able. https://www.prepareforyourcare.org is a great website to help you navigate this. - Depression is common in our stressful world.If you're feeling down or losing interest in things you normally enjoy, please come in for a visit.  - Violence - If anyone is threatening or hurting you, please call immediately.

## 2022-04-13 NOTE — Assessment & Plan Note (Signed)
Recheck a1c and adjust as indicated. 

## 2022-04-13 NOTE — Assessment & Plan Note (Signed)
Recheck labs 

## 2022-04-13 NOTE — Assessment & Plan Note (Signed)
Doing well with appropriate use of sumatriptan, continue.

## 2022-04-13 NOTE — Assessment & Plan Note (Addendum)
Doing well on current regimen, no changes made today. Obtaining labs. 

## 2022-04-14 ENCOUNTER — Other Ambulatory Visit: Payer: Self-pay

## 2022-04-14 ENCOUNTER — Telehealth: Payer: Self-pay

## 2022-04-14 DIAGNOSIS — Z1211 Encounter for screening for malignant neoplasm of colon: Secondary | ICD-10-CM

## 2022-04-14 MED ORDER — NA SULFATE-K SULFATE-MG SULF 17.5-3.13-1.6 GM/177ML PO SOLN: 354.0000 mL | Freq: Once | ORAL | 0 refills | Status: AC

## 2022-04-14 NOTE — Telephone Encounter (Signed)
Gastroenterology Pre-Procedure Review  Request Date: 05/06/2022 Requesting Physician: Dr. Allegra Lai   PATIENT REVIEW QUESTIONS: The patient responded to the following health history questions as indicated:    1. Are you having any GI issues? no 2. Do you have a personal history of Polyps? no 3. Do you have a family history of Colon Cancer or Polyps? no 4. Diabetes Mellitus? Yes Jardiance Metformin  5. Joint replacements in the past 12 months?no 6. Major health problems in the past 3 months?no 7. Any artificial heart valves, MVP, or defibrillator?no    MEDICATIONS & ALLERGIES:    Patient reports the following regarding taking any anticoagulation/antiplatelet therapy:   Plavix, Coumadin, Eliquis, Xarelto, Lovenox, Pradaxa, Brilinta, or Effient? no Aspirin? no  Patient confirms/reports the following medications:  Current Outpatient Medications  Medication Sig Dispense Refill   atorvastatin (LIPITOR) 20 MG tablet Take 1 tablet (20 mg total) by mouth daily. 90 tablet 3   Boswellia-Glucosamine-Vit D (OSTEO BI-FLEX ONE PER DAY PO) Take by mouth.     empagliflozin (JARDIANCE) 10 MG TABS tablet Take 1 tablet (10 mg total) by mouth daily before breakfast. Please schedule an office visit before anymore refills. 90 tablet 3   Fluocinolone Acetonide 0.01 % OIL SMARTSIG:1-2 Drop(s) In Ear(s) 1 to 2 Times Daily 20 mL 5   Fluocinolone Acetonide Body 0.01 % OIL APPLY TOPICALLY TO BODY TWICE DAILY AS NEEDED. AVOID FACE, GROIN, AXILLA 118.28 mL 1   metFORMIN (GLUCOPHAGE-XR) 500 MG 24 hr tablet TAKE 2 TABLETS(1000 MG) BY MOUTH DAILY 180 tablet 4   SUMAtriptan (IMITREX) 100 MG tablet Take 1 tablet (100 mg total) by mouth every 2 (two) hours as needed for migraine. No more than 200mg  in 24 hr period 10 tablet 5   No current facility-administered medications for this visit.    Patient confirms/reports the following allergies:  Allergies  Allergen Reactions   Elemental Sulfur Other (See Comments)   Sulfa  Antibiotics    Sulfamethoxazole-Trimethoprim Rash    No orders of the defined types were placed in this encounter.   AUTHORIZATION INFORMATION Primary Insurance: 1D#: Group #:  Secondary Insurance: 1D#: Group #:  SCHEDULE INFORMATION: Date:  Time: Location:

## 2022-04-15 LAB — COMPREHENSIVE METABOLIC PANEL
ALT: 26 IU/L (ref 0–32)
AST: 19 IU/L (ref 0–40)
Albumin/Globulin Ratio: 1.7 (ref 1.2–2.2)
Albumin: 4.9 g/dL (ref 3.8–4.9)
Alkaline Phosphatase: 116 IU/L (ref 44–121)
BUN/Creatinine Ratio: 18 (ref 12–28)
BUN: 15 mg/dL (ref 8–27)
Bilirubin Total: 0.4 mg/dL (ref 0.0–1.2)
CO2: 17 mmol/L — ABNORMAL LOW (ref 20–29)
Calcium: 9.8 mg/dL (ref 8.7–10.3)
Chloride: 105 mmol/L (ref 96–106)
Creatinine, Ser: 0.83 mg/dL (ref 0.57–1.00)
Globulin, Total: 2.9 g/dL (ref 1.5–4.5)
Glucose: 116 mg/dL — ABNORMAL HIGH (ref 70–99)
Potassium: 4.2 mmol/L (ref 3.5–5.2)
Sodium: 143 mmol/L (ref 134–144)
Total Protein: 7.8 g/dL (ref 6.0–8.5)
eGFR: 81 mL/min/{1.73_m2} (ref >59)

## 2022-04-15 LAB — HEMOGLOBIN A1C
Est. average glucose Bld gHb Est-mCnc: 143 mg/dL
Hgb A1c MFr Bld: 6.6 % — ABNORMAL HIGH (ref 4.8–5.6)

## 2022-04-15 LAB — CBC WITH DIFFERENTIAL/PLATELET
Basophils Absolute: 0 10*3/uL (ref 0.0–0.2)
Basos: 1 %
EOS (ABSOLUTE): 0.1 10*3/uL (ref 0.0–0.4)
Eos: 1 %
Hematocrit: 51.3 % — ABNORMAL HIGH (ref 34.0–46.6)
Hemoglobin: 16.7 g/dL — ABNORMAL HIGH (ref 11.1–15.9)
Immature Grans (Abs): 0 10*3/uL (ref 0.0–0.1)
Immature Granulocytes: 0 %
Lymphocytes Absolute: 2.9 10*3/uL (ref 0.7–3.1)
Lymphs: 44 %
MCH: 30.9 pg (ref 26.6–33.0)
MCHC: 32.6 g/dL (ref 31.5–35.7)
MCV: 95 fL (ref 79–97)
Monocytes Absolute: 0.4 10*3/uL (ref 0.1–0.9)
Monocytes: 6 %
Neutrophils Absolute: 3.1 10*3/uL (ref 1.4–7.0)
Neutrophils: 48 %
Platelets: 285 10*3/uL (ref 150–450)
RBC: 5.4 x10E6/uL — ABNORMAL HIGH (ref 3.77–5.28)
RDW: 13.2 % (ref 11.7–15.4)
WBC: 6.6 10*3/uL (ref 3.4–10.8)

## 2022-04-15 LAB — HEPATITIS C ANTIBODY: Hep C Virus Ab: NONREACTIVE

## 2022-04-15 LAB — VITAMIN B12: Vitamin B-12: 246 pg/mL (ref 232–1245)

## 2022-04-15 LAB — TSH: TSH: 0.797 u[IU]/mL (ref 0.450–4.500)
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
## 2022-04-28 ENCOUNTER — Encounter: Payer: Self-pay | Admitting: Gastroenterology

## 2022-05-06 ENCOUNTER — Encounter: Payer: Self-pay | Admitting: Gastroenterology

## 2022-05-06 ENCOUNTER — Ambulatory Visit: Payer: 59 | Admitting: Anesthesiology

## 2022-05-06 ENCOUNTER — Other Ambulatory Visit: Payer: Self-pay

## 2022-05-06 ENCOUNTER — Ambulatory Visit
Admission: RE | Admit: 2022-05-06 | Discharge: 2022-05-06 | Disposition: A | Discharge status: Home / Self Care | Payer: 59 | Attending: Gastroenterology | Admitting: Gastroenterology

## 2022-05-06 ENCOUNTER — Encounter
Admission: RE | Disposition: A | Discharge status: Home / Self Care | Payer: Self-pay | Source: Home / Self Care | Attending: Gastroenterology

## 2022-05-06 ENCOUNTER — Ambulatory Visit (AMBULATORY_SURGERY_CENTER): Payer: 59 | Admitting: Anesthesiology

## 2022-05-06 DIAGNOSIS — Z1211 Encounter for screening for malignant neoplasm of colon: Secondary | ICD-10-CM | POA: Insufficient documentation

## 2022-05-06 DIAGNOSIS — I10 Essential (primary) hypertension: Secondary | ICD-10-CM | POA: Insufficient documentation

## 2022-05-06 DIAGNOSIS — K5730 Diverticulosis of large intestine without perforation or abscess without bleeding: Secondary | ICD-10-CM | POA: Insufficient documentation

## 2022-05-06 DIAGNOSIS — E1165 Type 2 diabetes mellitus with hyperglycemia: Secondary | ICD-10-CM

## 2022-05-06 DIAGNOSIS — F17290 Nicotine dependence, other tobacco product, uncomplicated: Secondary | ICD-10-CM | POA: Insufficient documentation

## 2022-05-06 DIAGNOSIS — E7800 Pure hypercholesterolemia, unspecified: Secondary | ICD-10-CM | POA: Insufficient documentation

## 2022-05-06 DIAGNOSIS — Z7984 Long term (current) use of oral hypoglycemic drugs: Secondary | ICD-10-CM | POA: Diagnosis not present

## 2022-05-06 DIAGNOSIS — K635 Polyp of colon: Secondary | ICD-10-CM | POA: Diagnosis not present

## 2022-05-06 DIAGNOSIS — G43909 Migraine, unspecified, not intractable, without status migrainosus: Secondary | ICD-10-CM | POA: Insufficient documentation

## 2022-05-06 DIAGNOSIS — E119 Type 2 diabetes mellitus without complications: Secondary | ICD-10-CM | POA: Diagnosis not present

## 2022-05-06 HISTORY — PX: COLONOSCOPY WITH PROPOFOL: SHX5780

## 2022-05-06 LAB — GLUCOSE, CAPILLARY
Glucose-Capillary: 144 mg/dL — ABNORMAL HIGH (ref 70–99)
Glucose-Capillary: 122 mg/dL — ABNORMAL HIGH (ref 70–99)

## 2022-05-06 SURGERY — COLONOSCOPY WITH PROPOFOL
Anesthesia: General

## 2022-05-06 MED ORDER — LIDOCAINE HCL (CARDIAC) PF 100 MG/5ML IV SOSY
PREFILLED_SYRINGE | INTRAVENOUS | Status: DC | PRN
  Administered 2022-05-06: 50 mg via INTRAVENOUS

## 2022-05-06 MED ORDER — SODIUM CHLORIDE 0.9 % IV SOLN: INTRAVENOUS | Status: DC

## 2022-05-06 MED ORDER — STERILE WATER FOR IRRIGATION IR SOLN
Status: DC | PRN
  Administered 2022-05-06: 1000 mL

## 2022-05-06 MED ORDER — PROPOFOL 10 MG/ML IV BOLUS
INTRAVENOUS | Status: DC | PRN
  Administered 2022-05-06: 80 mg via INTRAVENOUS
  Administered 2022-05-06: 20 mg via INTRAVENOUS
  Administered 2022-05-06: 30 mg via INTRAVENOUS
  Administered 2022-05-06 (×2): 20 mg via INTRAVENOUS

## 2022-05-06 MED ORDER — LACTATED RINGERS IV SOLN: INTRAVENOUS | Status: DC

## 2022-05-06 SURGICAL SUPPLY — 25 items

## 2022-05-06 NOTE — Op Note (Signed)
[facility] Gastroenterology Patient Name: Lisa Alvarado Procedure Date: 05/06/2022 9:40 AM MRN: [id number] Account #: [id number] Date of Birth: June 24, 1961 Admit Type: Outpatient Age: 61 Room: [facility] OR ROOM 01 Gender: Female Note Status: Finalized Instrument Name: [id number] Procedure:             Colonoscopy Indications:           Screening for colorectal malignant neoplasm, Last                         colonoscopy 10 years ago Providers:             Lin Landsman MD, MD Referring MD:          Dionne Bucy. Bacigalupo (Referring MD) Medicines:             General Anesthesia Complications:         No immediate complications. Estimated blood loss: None. Procedure:             Pre-Anesthesia Assessment:                        - Prior to the procedure, a History and Physical was                         performed, and patient medications and allergies were                         reviewed. The patient is competent. The risks and                         benefits of the procedure and the sedation options and                         risks were discussed with the patient. All questions                         were answered and informed consent was obtained.                         Patient identification and proposed procedure were                         verified by the physician, the nurse, the                         anesthesiologist, the anesthetist and the technician                         in the pre-procedure area in the procedure room in the                         endoscopy suite. Mental Status Examination: alert and                         oriented. Airway Examination: normal oropharyngeal                         airway and neck mobility. Respiratory Examination:  clear to auscultation. CV Examination: normal.                         Prophylactic Antibiotics: The patient does not require                         prophylactic antibiotics. Prior  Anticoagulants: The                         patient has taken no previous anticoagulant or                         antiplatelet agents. ASA Grade Assessment: II - A                         patient with mild systemic disease. After reviewing                         the risks and benefits, the patient was deemed in                         satisfactory condition to undergo the procedure. The                         anesthesia plan was to use general anesthesia.                         Immediately prior to administration of medications,                         the patient was re-assessed for adequacy to receive                         sedatives. The heart rate, respiratory rate, oxygen                         saturations, blood pressure, adequacy of pulmonary                         ventilation, and response to care were monitored                         throughout the procedure. The physical status of the                         patient was re-assessed after the procedure.                        After obtaining informed consent, the colonoscope was                         passed under direct vision. Throughout the procedure,                         the patient's blood pressure, pulse, and oxygen                         saturations were monitored continuously. The  Colonoscope was introduced through the anus and                         advanced to the the terminal ileum, with                         identification of the appendiceal orifice and IC                         valve. The colonoscopy was performed without                         difficulty. The patient tolerated the procedure well.                         The quality of the bowel preparation was evaluated                         using the BBPS [facility] Bowel Preparation Scale) with                         scores of: Right Colon = 3, Transverse Colon = 3 and                         Left Colon = 3 (entire mucosa  seen well with no                         residual staining, small fragments of stool or opaque                         liquid). The total BBPS score equals 9. Findings:      The perianal and digital rectal examinations were normal. Pertinent       negatives include normal sphincter tone and no palpable rectal lesions.      Two sessile polyps were found in the sigmoid colon and descending colon.       The polyps were 3 to 4 mm in size. These polyps were removed with a cold       snare. Resection and retrieval were complete.      The retroflexed view of the distal rectum and anal verge was normal and       showed no anal or rectal abnormalities.      Multiple diverticula were found in the sigmoid colon. Impression:            - Two 3 to 4 mm polyps in the sigmoid colon and in the                         descending colon, removed with a cold snare. Resected                         and retrieved.                        - The distal rectum and anal verge are normal on                         retroflexion view.                        -  Diverticulosis in the sigmoid colon. Recommendation:        - Discharge patient to home (with escort).                        - Resume previous diet today.                        - Continue present medications.                        - Await pathology results.                        - Repeat colonoscopy in 7-10 years for surveillance                         based on pathology results. Procedure Code(s):     --- Professional ---                        [phone], Colonoscopy, flexible; with removal of                         tumor(s), polyp(s), or other lesion(s) by snare                         technique Diagnosis Code(s):     --- Professional ---                        Z12.11, Encounter for screening for malignant neoplasm                         of colon                        K63.5, Polyp of colon                        K57.30, Diverticulosis of large  intestine without                         perforation or abscess without bleeding CPT copyright 2019 American Medical Association. All rights reserved. The codes documented in this report are preliminary and upon coder review may  be revised to meet current compliance requirements. Dr. Libby Maw Toney Reil MD, MD 05/06/2022 10:07:30 AM This report has been signed electronically. Number of Addenda: 0 Note Initiated On: 05/06/2022 9:40 AM Scope Withdrawal Time: 0 hours 10 minutes 12 seconds  Total Procedure Duration: 0 hours 16 minutes 2 seconds  Estimated Blood Loss:  Estimated blood loss: none.      [facility]

## 2022-05-06 NOTE — Anesthesia Postprocedure Evaluation (Signed)
Anesthesia Post Note  Patient: Lisa Alvarado  Procedure(s) Performed: COLONOSCOPY WITH PROPOFOL WITH POLYPECTOMY     Patient location during evaluation: PACU Anesthesia Type: General Level of consciousness: awake and alert Pain management: pain level controlled Vital Signs Assessment: post-procedure vital signs reviewed and stable Respiratory status: spontaneous breathing, nonlabored ventilation and respiratory function stable Cardiovascular status: blood pressure returned to baseline and stable Postop Assessment: no apparent nausea or vomiting Anesthetic complications: no   No notable events documented.  [place] Ouch

## 2022-05-06 NOTE — Transfer of Care (Signed)
Immediate Anesthesia Transfer of Care Note  Patient: Lisa Alvarado  Procedure(s) Performed: COLONOSCOPY WITH PROPOFOL WITH POLYPECTOMY  Patient Location: PACU  Anesthesia Type: General  Level of Consciousness: awake, alert  and patient cooperative  Airway and Oxygen Therapy: Patient Spontanous Breathing and Patient connected to supplemental oxygen  Post-op Assessment: Post-op Vital signs reviewed, Patient's Cardiovascular Status Stable, Respiratory Function Stable, Patent Airway and No signs of Nausea or vomiting  Post-op Vital Signs: Reviewed and stable  Complications: No notable events documented.
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
## 2022-05-07 ENCOUNTER — Encounter: Payer: Self-pay | Admitting: Gastroenterology

## 2022-05-07 LAB — SURGICAL PATHOLOGY

## 2022-11-01 ENCOUNTER — Other Ambulatory Visit: Payer: Self-pay | Admitting: Family Medicine

## 2023-02-03 ENCOUNTER — Other Ambulatory Visit: Payer: Self-pay | Admitting: Family Medicine

## 2023-02-03 MED ORDER — METFORMIN HCL ER 500 MG PO TB24: ORAL_TABLET | ORAL | 0 refills | Status: DC

## 2023-02-03 NOTE — Telephone Encounter (Signed)
Requested Prescriptions  Pending Prescriptions Disp Refills   metFORMIN (GLUCOPHAGE-XR) 500 MG 24 hr tablet 60 tablet 0    Sig: TAKE 2 TABLETS(1000 MG) BY MOUTH DAILY     Endocrinology:  Diabetes - Biguanides Failed - 02/03/2023  3:29 PM      Failed - HBA1C is between 0 and 7.9 and within 180 days    Hgb A1c MFr Bld  Date Value Ref Range Status  04/12/2022 6.6 (H) 4.8 - 5.6 % Final    Comment:             Prediabetes: 5.7 - 6.4          Diabetes: >6.4          Glycemic control for adults with diabetes: <7.0          Failed - Valid encounter within last 6 months    Recent Outpatient Visits           9 months ago Annual physical exam   [facility] Caro Laroche, DO   1 year ago Encounter for IUD removal   [facility] [place], Marzella Schlein, MD   1 year ago Type 2 diabetes mellitus with other specified complication, without long-term current use of insulin [facility])   [place] [facility] [place], Marzella Schlein, MD   1 year ago Benign essential HTN   [place] [facility] Malva Limes, MD   2 years ago Type 2 diabetes mellitus with hyperglycemia, without long-term current use of insulin [facility])   [place] [facility] Malva Limes, MD       Future Appointments             In 2 weeks Bacigalupo, Marzella Schlein, MD [facility]            Passed - Cr in normal range and within 360 days    Creatinine, Ser  Date Value Ref Range Status  04/12/2022 0.83 0.57 - 1.00 mg/dL Final         Passed - eGFR in normal range and within 360 days    GFR calc Af Amer  Date Value Ref Range Status  03/03/2020 65 >59 mL/min/1.73 Final    Comment:    **[facility] currently reports eGFR in compliance with the current**   recommendations of the [employer]. [facility] will   update reporting as new guidelines are published from the NKF-ASN   Task  force.    GFR calc non Af Amer  Date Value Ref Range Status  03/03/2020 56 (L) >59 mL/min/1.73 Final   eGFR  Date Value Ref Range Status  04/12/2022 81 >59 mL/min/1.73 Final         Passed - B12 Level in normal range and within 720 days    Vitamin B-12  Date Value Ref Range Status  04/12/2022 246 232 - 1,245 pg/mL Final         Passed - CBC within normal limits and completed in the last 12 months    WBC  Date Value Ref Range Status  04/12/2022 6.6 3.4 - 10.8 x10E3/uL Final   RBC  Date Value Ref Range Status  04/12/2022 5.40 (H) 3.77 - 5.28 x10E6/uL Final   Hemoglobin  Date Value Ref Range Status  04/12/2022 16.7 (H) 11.1 - 15.9 g/dL Final   Hematocrit  Date Value Ref Range Status  04/12/2022 51.3 (H) 34.0 - 46.6 % Final  MCHC  Date Value Ref Range Status  04/12/2022 32.6 31.5 - 35.7 g/dL Final   [facility]  Date Value Ref Range Status  04/12/2022 30.9 26.6 - 33.0 pg Final   MCV  Date Value Ref Range Status  04/12/2022 95 79 - 97 fL Final   No results found for: "PLTCOUNTKUC", "LABPLAT", "POCPLA" RDW  Date Value Ref Range Status  04/12/2022 13.2 11.7 - 15.4 % Final

## 2023-02-03 NOTE — Telephone Encounter (Signed)
Medication Refill - Medication: metFORMIN (GLUCOPHAGE-XR) 500 MG 24 hr tablet  Pt now has appt scheduled for soonest July 9  Has the patient contacted their pharmacy? yes (Agent: If no, request that the patient contact the pharmacy for the refill. If patient does not wish to contact the pharmacy document the reason why and proceed with request.) (Agent: If yes, when and what did the pharmacy advise?)contact pcp  Preferred Pharmacy (with phone number or street name):  [id number] - [place], [place] - [address] AT [facility] OF [address] [facility] Phone: [phone]  Fax: [phone]     Has the patient been seen for an appointment in the last year OR does the patient have an upcoming appointment? yes  Agent: Please be advised that RX refills may take up to 3 business days. We ask that you follow-up with your pharmacy.

## 2023-02-03 NOTE — Telephone Encounter (Signed)
Requested medication (s) are due for refill today: Yes  Requested medication (s) are on the active medication list: Yes  Last refill:  11/02/22  Future visit scheduled: No  Notes to clinic:  Left a message to call to make appointment.    Requested Prescriptions  Pending Prescriptions Disp Refills   metFORMIN (GLUCOPHAGE-XR) 500 MG 24 hr tablet [Pharmacy Med Name: METFORMIN ER 500MG  24HR TABS] 180 tablet 0    Sig: TAKE 2 TABLETS(1000 MG) BY MOUTH DAILY     Endocrinology:  Diabetes - Biguanides Failed - 02/03/2023 10:38 AM      Failed - HBA1C is between 0 and 7.9 and within 180 days    Hgb A1c MFr Bld  Date Value Ref Range Status  04/12/2022 6.6 (H) 4.8 - 5.6 % Final    Comment:             Prediabetes: 5.7 - 6.4          Diabetes: >6.4          Glycemic control for adults with diabetes: <7.0          Failed - Valid encounter within last 6 months    Recent Outpatient Visits           9 months ago Annual physical exam   [facility] Caro Laroche, DO   1 year ago Encounter for IUD removal   [place] [facility] [place], Marzella Schlein, MD   1 year ago Type 2 diabetes mellitus with other specified complication, without long-term current use of insulin [facility])   [place] [facility] [place], Marzella Schlein, MD   1 year ago Benign essential HTN   [place] [facility] Malva Limes, MD   2 years ago Type 2 diabetes mellitus with hyperglycemia, without long-term current use of insulin [facility])   [facility] Malva Limes, MD              Passed - Cr in normal range and within 360 days    Creatinine, Ser  Date Value Ref Range Status  04/12/2022 0.83 0.57 - 1.00 mg/dL Final         Passed - eGFR in normal range and within 360 days    GFR calc Af Amer  Date Value Ref Range Status  03/03/2020 65 >59 mL/min/1.73 Final    Comment:    **[facility] currently reports eGFR  in compliance with the current**   recommendations of the [employer]. [facility] will   update reporting as new guidelines are published from the NKF-ASN   Task force.    GFR calc non Af Amer  Date Value Ref Range Status  03/03/2020 56 (L) >59 mL/min/1.73 Final   eGFR  Date Value Ref Range Status  04/12/2022 81 >59 mL/min/1.73 Final         Passed - B12 Level in normal range and within 720 days    Vitamin B-12  Date Value Ref Range Status  04/12/2022 246 232 - 1,245 pg/mL Final         Passed - CBC within normal limits and completed in the last 12 months    WBC  Date Value Ref Range Status  04/12/2022 6.6 3.4 - 10.8 x10E3/uL Final   RBC  Date Value Ref Range Status  04/12/2022 5.40 (H) 3.77 - 5.28 x10E6/uL Final   Hemoglobin  Date Value Ref Range Status  04/12/2022 16.7 (H) 11.1 -  15.9 g/dL Final   Hematocrit  Date Value Ref Range Status  04/12/2022 51.3 (H) 34.0 - 46.6 % Final   MCHC  Date Value Ref Range Status  04/12/2022 32.6 31.5 - 35.7 g/dL Final   [facility]  Date Value Ref Range Status  04/12/2022 30.9 26.6 - 33.0 pg Final   MCV  Date Value Ref Range Status  04/12/2022 95 79 - 97 fL Final   No results found for: "PLTCOUNTKUC", "LABPLAT", "POCPLA" RDW  Date Value Ref Range Status  04/12/2022 13.2 11.7 - 15.4 % Final

## 2023-02-22 ENCOUNTER — Ambulatory Visit: Payer: 59 | Admitting: Family Medicine

## 2023-02-22 ENCOUNTER — Encounter: Payer: Self-pay | Admitting: Family Medicine

## 2023-02-22 VITALS — BP 122/87 | HR 96 | Temp 97.6°F | Resp 12 | Ht 66.0 in | Wt 154.1 lb

## 2023-02-22 DIAGNOSIS — E1159 Type 2 diabetes mellitus with other circulatory complications: Secondary | ICD-10-CM | POA: Diagnosis not present

## 2023-02-22 DIAGNOSIS — G43709 Chronic migraine without aura, not intractable, without status migrainosus: Secondary | ICD-10-CM

## 2023-02-22 DIAGNOSIS — E1165 Type 2 diabetes mellitus with hyperglycemia: Secondary | ICD-10-CM

## 2023-02-22 DIAGNOSIS — E785 Hyperlipidemia, unspecified: Secondary | ICD-10-CM

## 2023-02-22 DIAGNOSIS — D582 Other hemoglobinopathies: Secondary | ICD-10-CM

## 2023-02-22 DIAGNOSIS — E1169 Type 2 diabetes mellitus with other specified complication: Secondary | ICD-10-CM | POA: Diagnosis not present

## 2023-02-22 DIAGNOSIS — I152 Hypertension secondary to endocrine disorders: Secondary | ICD-10-CM

## 2023-02-22 DIAGNOSIS — R631 Polydipsia: Secondary | ICD-10-CM

## 2023-02-22 MED ORDER — SUMATRIPTAN SUCCINATE 100 MG PO TABS: 100.0000 mg | ORAL_TABLET | ORAL | 5 refills | Status: DC | PRN

## 2023-02-22 MED ORDER — METFORMIN HCL ER 500 MG PO TB24: 1000.0000 mg | ORAL_TABLET | Freq: Every day | ORAL | 1 refills | Status: DC

## 2023-02-22 MED ORDER — EMPAGLIFLOZIN 10 MG PO TABS: 10.0000 mg | ORAL_TABLET | Freq: Every day | ORAL | 3 refills | Status: DC

## 2023-02-22 NOTE — Progress Notes (Signed)
Established Patient Office Visit  Subjective   Patient ID: Lisa Alvarado, female    DOB: 1960-08-25  Age: 62 y.o. MRN: [id number]  Chief Complaint  Patient presents with   Medical Management of Chronic Issues    Empryss is here today for a follow up for medical management of DM and DM associated hypertension and hyperlipidemia.  Since her last visit, she feels as though her DM is well controlled. She tracks her blood sugar every morning and it has ranged from 90 - 130 in the past month. She denies any episodes of hypoglycemia. She has had about 2 episodes of hyperglycemia over the past year with nausea, shaking, and a headache. She has been making lifestyle changes that include hiking 1-3 times weekly. She wants to get off of Metformin due to things she had heard from friends. She has an occasional upset stomach and wondered if her medications could be contributing. A major concern of hers is her increased thirst. Over the past year, she has noticed increased levels of thirst and increased frequency of urination. She usually drinks 10 cups of a 32 oz water bottle. The increased frequency of urination is frustrating for her.     Review of Systems  Constitutional:  Positive for malaise/fatigue and weight loss.  Genitourinary:  Positive for frequency.  Endo/Heme/Allergies:  Positive for polydipsia.      Objective:     BP 122/87 (BP Location: Left Arm, Patient Position: Sitting, Cuff Size: Large)   Pulse 96   Temp 97.6 F (36.4 C) (Temporal)   Resp 12   Ht 5\' 6"  (1.676 m)   Wt 154 lb 1.6 oz (69.9 kg)   LMP  (LMP Unknown)   SpO2 99%   BMI 24.87 kg/m  Wt Readings from Last 3 Encounters:  02/22/23 154 lb 1.6 oz (69.9 kg)  05/06/22 169 lb 12.1 oz (77 kg)  04/12/22 170 lb 14.4 oz (77.5 kg)    Physical Exam Constitutional:      Appearance: Normal appearance.  Skin:    General: Skin is warm.  Neurological:     General: No focal deficit present.     Mental Status: She is  alert and oriented to person, place, and time.     No results found for any visits on 02/22/23.    The 10-year ASCVD risk score (Arnett DK, et al., 2019) is: 12.4%    Assessment & Plan:   Problem List Items Addressed This Visit       Cardiovascular and Mediastinum   Hypertension associated with diabetes (HCC)    Blood pressure currently well controlled. Today's BP was 122 / 87. She has stopped taking her Statin over a year ago. Continue to manage with lifestyle modifications  Consider restarting Statin pending Lipid panel       Relevant Medications   empagliflozin (JARDIANCE) 10 MG TABS tablet   metFORMIN (GLUCOPHAGE-XR) 500 MG 24 hr tablet   Other Relevant Orders   Comprehensive metabolic panel   Headache, migraine   Relevant Medications   SUMAtriptan (IMITREX) 100 MG tablet     Endocrine   Hyperlipidemia associated with type 2 diabetes mellitus (HCC)    Stopped taking statin over a year ago. Last lipid panel had values within normal limits.  Lipid panel pending  Consider restarting statin pending results      Relevant Medications   empagliflozin (JARDIANCE) 10 MG TABS tablet   metFORMIN (GLUCOPHAGE-XR) 500 MG 24 hr tablet   Other Relevant Orders  Comprehensive metabolic panel   Lipid Panel With LDL/HDL Ratio   Type 2 diabetes mellitus with hyperglycemia, without long-term current use of insulin (HCC) - Primary    Patient feels DM is currently well controlled. Morning blood sugars have been within goal range. Last HbgA1c was within goal at 6.6. POCT glucose pending HbgA1c pending  Continue current medications pending lab results       Relevant Medications   empagliflozin (JARDIANCE) 10 MG TABS tablet   metFORMIN (GLUCOPHAGE-XR) 500 MG 24 hr tablet   Other Relevant Orders   Hemoglobin A1c   Urine Microalbumin w/creat. ratio     Other   Polydipsia    Patient has had increased thirst for the past year. It has remained constant. Concerning for high blood  sugar or electrolyte abnormalities contributing. CMP pending HbgA1c pending   Management recs pending labs       Relevant Orders   Comprehensive metabolic panel   Hemoglobin A1c   TSH   Elevated hemoglobin (HCC)    Last hemoglobin value elevated to 16.7. All previous hemoglobin values were within normal limits.  CBC pending  Continue to monitor      Relevant Orders   CBC    Return in about 6 months (around 08/25/2023) for CPE.    [name], Medical Student   Patient seen along with MS3 student [name]. I personally evaluated this patient along with the student, and verified all aspects of the history, physical exam, and medical decision making as documented by the student. I agree with the student's documentation and have made all necessary edits.  Larry Alcock, Marzella Schlein, MD, MPH [facility]

## 2023-02-22 NOTE — Assessment & Plan Note (Signed)
Patient has had increased thirst for the past year. It has remained constant. Concerning for high blood sugar or electrolyte abnormalities contributing. CMP pending HbgA1c pending   Management recs pending labs

## 2023-02-22 NOTE — Assessment & Plan Note (Signed)
Patient feels DM is currently well controlled. Morning blood sugars have been within goal range. Last HbgA1c was within goal at 6.6. POCT glucose pending HbgA1c pending  Continue current medications pending lab results

## 2023-02-22 NOTE — Assessment & Plan Note (Signed)
Stopped taking statin over a year ago. Last lipid panel had values within normal limits.  Lipid panel pending  Consider restarting statin pending results

## 2023-02-22 NOTE — Assessment & Plan Note (Signed)
Blood pressure currently well controlled. Today's BP was 122 / 87. She has stopped taking her Statin over a year ago. Continue to manage with lifestyle modifications  Consider restarting Statin pending Lipid panel

## 2023-02-22 NOTE — Assessment & Plan Note (Signed)
Last hemoglobin value elevated to 16.7. All previous hemoglobin values were within normal limits.  CBC pending  Continue to monitor

## 2023-02-23 LAB — LIPID PANEL WITH LDL/HDL RATIO
Cholesterol, Total: 244 mg/dL — ABNORMAL HIGH (ref 100–199)
HDL: 44 mg/dL (ref >39)
LDL Chol Calc (NIH): 162 mg/dL — ABNORMAL HIGH (ref 0–99)
LDL/HDL Ratio: 3.7 ratio — ABNORMAL HIGH (ref 0.0–3.2)
Triglycerides: 206 mg/dL — ABNORMAL HIGH (ref 0–149)
VLDL Cholesterol Cal: 38 mg/dL (ref 5–40)

## 2023-02-23 LAB — COMPREHENSIVE METABOLIC PANEL
ALT: 10 IU/L (ref 0–32)
AST: 12 IU/L (ref 0–40)
Albumin: 4.9 g/dL (ref 3.9–4.9)
Alkaline Phosphatase: 97 IU/L (ref 44–121)
BUN/Creatinine Ratio: 14 (ref 12–28)
BUN: 13 mg/dL (ref 8–27)
Bilirubin Total: 0.5 mg/dL (ref 0.0–1.2)
CO2: 18 mmol/L — ABNORMAL LOW (ref 20–29)
Calcium: 10 mg/dL (ref 8.7–10.3)
Chloride: 104 mmol/L (ref 96–106)
Creatinine, Ser: 0.93 mg/dL (ref 0.57–1.00)
Globulin, Total: 2.7 g/dL (ref 1.5–4.5)
Glucose: 110 mg/dL — ABNORMAL HIGH (ref 70–99)
Potassium: 4.8 mmol/L (ref 3.5–5.2)
Sodium: 141 mmol/L (ref 134–144)
Total Protein: 7.6 g/dL (ref 6.0–8.5)
eGFR: 70 mL/min/{1.73_m2} (ref >59)

## 2023-02-23 LAB — HEMOGLOBIN A1C
Est. average glucose Bld gHb Est-mCnc: 120 mg/dL
Hgb A1c MFr Bld: 5.8 % — ABNORMAL HIGH (ref 4.8–5.6)

## 2023-02-23 LAB — TSH: TSH: 1.81 u[IU]/mL (ref 0.450–4.500)

## 2023-02-23 LAB — CBC
Hematocrit: 50.8 % — ABNORMAL HIGH (ref 34.0–46.6)
Hemoglobin: 16.7 g/dL — ABNORMAL HIGH (ref 11.1–15.9)
MCH: 30 pg (ref 26.6–33.0)
MCHC: 32.9 g/dL (ref 31.5–35.7)
MCV: 91 fL (ref 79–97)
Platelets: 247 10*3/uL (ref 150–450)
RBC: 5.56 x10E6/uL — ABNORMAL HIGH (ref 3.77–5.28)
RDW: 13.2 % (ref 11.7–15.4)
WBC: 5.7 10*3/uL (ref 3.4–10.8)

## 2023-02-23 LAB — MICROALBUMIN / CREATININE URINE RATIO
Creatinine, Urine: 86.8 mg/dL
Microalb/Creat Ratio: 9 mg/g creat (ref 0–29)
Microalbumin, Urine: 7.6 ug/mL

## 2023-02-25 ENCOUNTER — Telehealth: Payer: Self-pay

## 2023-02-25 MED ORDER — ATORVASTATIN CALCIUM 20 MG PO TABS: 20.0000 mg | ORAL_TABLET | Freq: Every day | ORAL | 3 refills | Status: DC

## 2023-02-25 NOTE — Telephone Encounter (Signed)
-----   Message from Shirlee Latch sent at 02/25/2023 11:59 AM EDT ----- Normal/stable labs, except for high cholesterol. Recommend restarting statin. Was previously on atorvastatin 20 mg daily which we will resume. CMAs please send in #[phone] [id number]. Will recheck at next visit.

## 2023-08-29 ENCOUNTER — Encounter: Payer: Self-pay | Admitting: Family Medicine

## 2023-08-29 ENCOUNTER — Ambulatory Visit: Payer: 59 | Admitting: Family Medicine

## 2023-08-29 VITALS — BP 130/76 | HR 86 | Ht 66.0 in | Wt 151.0 lb

## 2023-08-29 DIAGNOSIS — E1159 Type 2 diabetes mellitus with other circulatory complications: Secondary | ICD-10-CM

## 2023-08-29 DIAGNOSIS — Z0001 Encounter for general adult medical examination with abnormal findings: Secondary | ICD-10-CM

## 2023-08-29 DIAGNOSIS — E1169 Type 2 diabetes mellitus with other specified complication: Secondary | ICD-10-CM | POA: Diagnosis not present

## 2023-08-29 DIAGNOSIS — Z1231 Encounter for screening mammogram for malignant neoplasm of breast: Secondary | ICD-10-CM

## 2023-08-29 DIAGNOSIS — E1165 Type 2 diabetes mellitus with hyperglycemia: Secondary | ICD-10-CM | POA: Diagnosis not present

## 2023-08-29 DIAGNOSIS — Z0000 Encounter for general adult medical examination without abnormal findings: Secondary | ICD-10-CM

## 2023-08-29 DIAGNOSIS — E785 Hyperlipidemia, unspecified: Secondary | ICD-10-CM

## 2023-08-29 DIAGNOSIS — Z7984 Long term (current) use of oral hypoglycemic drugs: Secondary | ICD-10-CM

## 2023-08-29 DIAGNOSIS — I152 Hypertension secondary to endocrine disorders: Secondary | ICD-10-CM

## 2023-08-29 DIAGNOSIS — D582 Other hemoglobinopathies: Secondary | ICD-10-CM

## 2023-08-29 MED ORDER — EMPAGLIFLOZIN 10 MG PO TABS: 10.0000 mg | ORAL_TABLET | Freq: Every day | ORAL | 3 refills | Status: DC

## 2023-08-29 MED ORDER — ATORVASTATIN CALCIUM 20 MG PO TABS: 20.0000 mg | ORAL_TABLET | Freq: Every day | ORAL | 3 refills | Status: DC

## 2023-08-29 MED ORDER — METFORMIN HCL ER 500 MG PO TB24: 1000.0000 mg | ORAL_TABLET | Freq: Every day | ORAL | 1 refills | Status: DC

## 2023-08-29 NOTE — Assessment & Plan Note (Signed)
 Blood pressure rechecked at 130/76 mmHg after initial elevation likely due to anxiety about Pap smear. Blood pressure is well-controlled. - Recheck blood pressure at next visit

## 2023-08-29 NOTE — Assessment & Plan Note (Signed)
 Chronic condition managed with atorvastatin 20 mg daily. Patient reports one refill left. - Continue atorvastatin 20 mg daily - Check refill status and ensure prescription is up to date

## 2023-08-29 NOTE — Progress Notes (Signed)
 Complete physical exam   Patient: Lisa Alvarado   DOB: March 04, 1961   63 y.o. Female  MRN: [id number] Visit Date: 08/29/2023  Today's healthcare provider: Jon Eva, MD   Chief Complaint  Patient presents with   Annual Exam   Subjective    Lisa Alvarado is a 63 y.o. female who presents today for a complete physical exam.  She reports consuming a general diet.  Hiking regular  She generally feels well. She reports sleeping well. She does not have additional problems to discuss today.    Discussed the use of AI scribe software for clinical note transcription with the patient, who gave verbal consent to proceed.  History of Present Illness   The patient, with a history of diabetes, presents for a routine physical examination. The patient is on metformin  XR, Jardiance , and atorvastatin  for diabetes management. The patient reports no new symptoms or complications related to diabetes. The patient has been exercising regularly, having walked 175 miles from mid-July to the beginning of December. The patient has set a goal of walking 400 miles in the upcoming year. The patient's medication refills are also discussed.        Last depression screening scores    02/22/2023    8:01 AM 04/12/2022   10:33 AM 09/17/2021    4:12 PM  PHQ 2/9 Scores  PHQ - 2 Score 1 0 0  PHQ- 9 Score  6 4   Last fall risk screening    02/22/2023    8:01 AM  Fall Risk   Falls in the past year? 1  Number falls in past yr: 0  Injury with Fall? 0  Risk for fall due to : History of fall(s)  Follow up Falls evaluation completed        Medications: Outpatient Medications Prior to Visit  Medication Sig   Boswellia-Glucosamine-Vit D (OSTEO BI-FLEX ONE PER DAY PO) Take by mouth.   Fluocinolone  Acetonide 0.01 % OIL SMARTSIG:1-2 Drop(s) In Ear(s) 1 to 2 Times Daily   Fluocinolone  Acetonide Body 0.01 % OIL APPLY TOPICALLY TO BODY TWICE DAILY AS NEEDED. AVOID FACE, GROIN, AXILLA   SUMAtriptan   (IMITREX ) 100 MG tablet Take 1 tablet (100 mg total) by mouth every 2 (two) hours as needed for migraine. No more than 200mg  in 24 hr period   [DISCONTINUED] atorvastatin  (LIPITOR) 20 MG tablet Take 1 tablet (20 mg total) by mouth daily.   [DISCONTINUED] empagliflozin  (JARDIANCE ) 10 MG TABS tablet Take 1 tablet (10 mg total) by mouth daily before breakfast. Please schedule an office visit before anymore refills.   [DISCONTINUED] metFORMIN  (GLUCOPHAGE -XR) 500 MG 24 hr tablet Take 2 tablets (1,000 mg total) by mouth daily with breakfast. TAKE 2 TABLETS(1000 MG) BY MOUTH DAILY   No facility-administered medications prior to visit.    Review of Systems    Objective    BP 130/76 (BP Location: Left Arm, Patient Position: Sitting, Cuff Size: Normal)   Pulse 86   Ht 5' 6 (1.676 m)   Wt 151 lb (68.5 kg)   LMP  (LMP Unknown)   SpO2 97%   BMI 24.37 kg/m    Physical Exam Vitals reviewed.  Constitutional:      General: She is not in acute distress.    Appearance: Normal appearance. She is well-developed. She is not diaphoretic.  HENT:     Head: Normocephalic and atraumatic.     Right Ear: Tympanic membrane, ear canal and external ear normal.  Left Ear: Tympanic membrane, ear canal and external ear normal.     Nose: Nose normal.     Mouth/Throat:     Mouth: Mucous membranes are moist.     Pharynx: Oropharynx is clear. No oropharyngeal exudate.  Eyes:     General: No scleral icterus.    Conjunctiva/sclera: Conjunctivae normal.     Pupils: Pupils are equal, round, and reactive to light.  Neck:     Thyroid: No thyromegaly.  Cardiovascular:     Rate and Rhythm: Normal rate and regular rhythm.     Heart sounds: Normal heart sounds. No murmur heard. Pulmonary:     Effort: Pulmonary effort is normal. No respiratory distress.     Breath sounds: Normal breath sounds. No wheezing or rales.  Chest:     Comments: Breasts: breasts appear normal, no suspicious masses, no skin or nipple  changes or axillary nodes  Abdominal:     General: There is no distension.     Palpations: Abdomen is soft.     Tenderness: There is no abdominal tenderness.  Musculoskeletal:        General: No deformity.     Cervical back: Neck supple.     Right lower leg: No edema.     Left lower leg: No edema.  Lymphadenopathy:     Cervical: No cervical adenopathy.  Skin:    General: Skin is warm and dry.     Findings: No rash.  Neurological:     Mental Status: She is alert and oriented to person, place, and time. Mental status is at baseline.     Gait: Gait normal.  Psychiatric:        Mood and Affect: Mood normal.        Behavior: Behavior normal.        Thought Content: Thought content normal.      No results found for any visits on 08/29/23.  Assessment & Plan    Routine Health Maintenance and Physical Exam  Exercise Activities and Dietary recommendations  Goals   None     Immunization History  Administered Date(s) Administered   Influenza,inj,Quad PF,6+ Mos 10/01/2016, 06/07/2019, 06/16/2020   Influenza-Unspecified 06/01/2015, 09/09/2021   PFIZER(Purple Top)SARS-COV-2 Vaccination 11/17/2019, 12/10/2019, 07/29/2020, 01/31/2021   PNEUMOCOCCAL CONJUGATE-20 04/12/2022   Pfizer Covid-19 Vaccine Bivalent Booster 35yrs & up 09/11/2021   Pneumococcal Polysaccharide-23 09/17/2020   Td 12/17/1993   Tdap 05/29/2010, 04/21/2021   Zoster Recombinant(Shingrix) 10/14/2021, 12/15/2021    Health Maintenance  Topic Date Due   OPHTHALMOLOGY EXAM  11/10/2022   COVID-19 Vaccine (6 - 2024-25 season) 04/17/2023   HEMOGLOBIN A1C  08/25/2023   INFLUENZA VACCINE  11/14/2023 (Originally 03/17/2023)   MAMMOGRAM  10/31/2023   Diabetic kidney evaluation - eGFR measurement  02/22/2024   Diabetic kidney evaluation - Urine ACR  02/22/2024   FOOT EXAM  08/28/2024   Cervical Cancer Screening (HPV/Pap Cotest)  10/09/2026   DTaP/Tdap/Td (4 - Td or Tdap) 04/22/2031   Colonoscopy  05/06/2032    Pneumococcal Vaccine 4-34 Years old  Completed   Hepatitis C Screening  Completed   HIV Screening  Completed   Zoster Vaccines- Shingrix  Completed   HPV VACCINES  Aged Out    Discussed health benefits of physical activity, and encouraged her to engage in regular exercise appropriate for her age and condition.  Problem List Items Addressed This Visit       Cardiovascular and Mediastinum   Hypertension associated with diabetes (HCC) - Primary  Blood pressure rechecked at 130/76 mmHg after initial elevation likely due to anxiety about Pap smear. Blood pressure is well-controlled. - Recheck blood pressure at next visit      Relevant Medications   atorvastatin  (LIPITOR) 20 MG tablet   empagliflozin  (JARDIANCE ) 10 MG TABS tablet   metFORMIN  (GLUCOPHAGE -XR) 500 MG 24 hr tablet   Other Relevant Orders   Comprehensive metabolic panel   Lipid panel     Endocrine   Hyperlipidemia associated with type 2 diabetes mellitus (HCC)   Chronic condition managed with atorvastatin  20 mg daily. Patient reports one refill left. - Continue atorvastatin  20 mg daily - Check refill status and ensure prescription is up to date      Relevant Medications   atorvastatin  (LIPITOR) 20 MG tablet   empagliflozin  (JARDIANCE ) 10 MG TABS tablet   metFORMIN  (GLUCOPHAGE -XR) 500 MG 24 hr tablet   Other Relevant Orders   Comprehensive metabolic panel   Lipid panel   Type 2 diabetes mellitus with hyperglycemia, without long-term current use of insulin (HCC)   Chronic condition managed with metformin  XR 500 mg (2 pills with breakfast daily) and Jardiance  10 mg (with breakfast). Blood work including A1c, kidney and liver function, cholesterol, and blood counts to be done today. Patient engages in regular physical activity, beneficial for diabetes management. Discussed the importance of continued exercise and setting a goal of 400 miles for the year. - Order blood work (A1c, kidney and liver function, cholesterol,  blood counts) - Continue metformin  XR 500 mg, 2 pills with breakfast daily - Continue Jardiance  10 mg with breakfast - Follow up in 6 months for diabetes management      Relevant Medications   atorvastatin  (LIPITOR) 20 MG tablet   empagliflozin  (JARDIANCE ) 10 MG TABS tablet   metFORMIN  (GLUCOPHAGE -XR) 500 MG 24 hr tablet   Other Relevant Orders   Hemoglobin A1c     Other   Elevated hemoglobin (HCC)   Recheck CBC Likely 2/2 smoking - advised cessation      Relevant Orders   CBC with Differential/Platelet   Other Visit Diagnoses       Encounter for annual physical exam       Relevant Orders   CBC with Differential/Platelet   Comprehensive metabolic panel   Lipid panel   Hemoglobin A1c     Breast cancer screening by mammogram       Relevant Orders   MM 3D SCREENING MAMMOGRAM BILATERAL BREAST           General Health Maintenance Up to date on most vaccinations and screenings. Due for flu shot, possibly COVID-19 booster, mammogram, and diabetic eye exam. Discussed importance of updated COVID-19 vaccines due to evolving virus strains. - Schedule flu shot at pharmacy - Discuss COVID-19 booster and consider scheduling - Order mammogram at preferred location - Check with eye doctor for diabetic eye exam  Follow-up - Follow up in 6 months for diabetes management and general health maintenance.        Return in about 6 months (around 02/26/2024) for chronic disease f/u.     Jon Eva, MD  [facility] [phone] (phone) [phone] (fax)  [facility]

## 2023-08-29 NOTE — Assessment & Plan Note (Signed)
 Chronic condition managed with metformin  XR 500 mg (2 pills with breakfast daily) and Jardiance  10 mg (with breakfast). Blood work including A1c, kidney and liver function, cholesterol, and blood counts to be done today. Patient engages in regular physical activity, beneficial for diabetes management. Discussed the importance of continued exercise and setting a goal of 400 miles for the year. - Order blood work (A1c, kidney and liver function, cholesterol, blood counts) - Continue metformin  XR 500 mg, 2 pills with breakfast daily - Continue Jardiance  10 mg with breakfast - Follow up in 6 months for diabetes management

## 2023-08-29 NOTE — Assessment & Plan Note (Signed)
 Recheck CBC Likely 2/2 smoking - advised cessation

## 2023-08-30 LAB — CBC WITH DIFFERENTIAL/PLATELET
Basophils Absolute: 0 10*3/uL (ref 0.0–0.2)
Basos: 0 %
EOS (ABSOLUTE): 0.1 10*3/uL (ref 0.0–0.4)
Eos: 1 %
Hematocrit: 47.3 % — ABNORMAL HIGH (ref 34.0–46.6)
Hemoglobin: 15.9 g/dL (ref 11.1–15.9)
Immature Grans (Abs): 0 10*3/uL (ref 0.0–0.1)
Immature Granulocytes: 0 %
Lymphocytes Absolute: 2 10*3/uL (ref 0.7–3.1)
Lymphs: 33 %
MCH: 30.2 pg (ref 26.6–33.0)
MCHC: 33.6 g/dL (ref 31.5–35.7)
MCV: 90 fL (ref 79–97)
Monocytes Absolute: 0.4 10*3/uL (ref 0.1–0.9)
Monocytes: 7 %
Neutrophils Absolute: 3.6 10*3/uL (ref 1.4–7.0)
Neutrophils: 59 %
Platelets: 263 10*3/uL (ref 150–450)
RBC: 5.27 x10E6/uL (ref 3.77–5.28)
RDW: 11.7 % (ref 11.7–15.4)
WBC: 6.1 10*3/uL (ref 3.4–10.8)

## 2023-08-30 LAB — COMPREHENSIVE METABOLIC PANEL
ALT: 12 [IU]/L (ref 0–32)
AST: 10 [IU]/L (ref 0–40)
Albumin: 4.8 g/dL (ref 3.9–4.9)
Alkaline Phosphatase: 112 [IU]/L (ref 44–121)
BUN/Creatinine Ratio: 14 (ref 12–28)
BUN: 14 mg/dL (ref 8–27)
Bilirubin Total: 0.6 mg/dL (ref 0.0–1.2)
CO2: 22 mmol/L (ref 20–29)
Calcium: 9.8 mg/dL (ref 8.7–10.3)
Chloride: 102 mmol/L (ref 96–106)
Creatinine, Ser: 0.98 mg/dL (ref 0.57–1.00)
Globulin, Total: 2.7 g/dL (ref 1.5–4.5)
Glucose: 116 mg/dL — ABNORMAL HIGH (ref 70–99)
Potassium: 4.6 mmol/L (ref 3.5–5.2)
Sodium: 143 mmol/L (ref 134–144)
Total Protein: 7.5 g/dL (ref 6.0–8.5)
eGFR: 65 mL/min/{1.73_m2} (ref >59)

## 2023-08-30 LAB — LIPID PANEL
Chol/HDL Ratio: 3.2 {ratio} (ref 0.0–4.4)
Cholesterol, Total: 154 mg/dL (ref 100–199)
HDL: 48 mg/dL (ref >39)
LDL Chol Calc (NIH): 86 mg/dL (ref 0–99)
Triglycerides: 109 mg/dL (ref 0–149)
VLDL Cholesterol Cal: 20 mg/dL (ref 5–40)

## 2023-08-30 LAB — HEMOGLOBIN A1C
Est. average glucose Bld gHb Est-mCnc: 134 mg/dL
Hgb A1c MFr Bld: 6.3 % — ABNORMAL HIGH (ref 4.8–5.6)

## 2024-02-23 ENCOUNTER — Ambulatory Visit
Admission: RE | Admit: 2024-02-23 | Discharge: 2024-02-23 | Disposition: A | Discharge status: Home / Self Care | Source: Ambulatory Visit | Attending: Family Medicine | Admitting: Family Medicine

## 2024-02-23 DIAGNOSIS — Z1231 Encounter for screening mammogram for malignant neoplasm of breast: Secondary | ICD-10-CM | POA: Insufficient documentation

## 2024-02-27 ENCOUNTER — Ambulatory Visit: Payer: Self-pay | Admitting: Family Medicine

## 2024-02-27 ENCOUNTER — Encounter: Payer: Self-pay | Admitting: Family Medicine

## 2024-02-27 VITALS — BP 138/80 | HR 76 | Resp 14 | Ht 66.0 in | Wt 150.6 lb

## 2024-02-27 DIAGNOSIS — E1165 Type 2 diabetes mellitus with hyperglycemia: Secondary | ICD-10-CM

## 2024-02-27 DIAGNOSIS — E1169 Type 2 diabetes mellitus with other specified complication: Secondary | ICD-10-CM

## 2024-02-27 DIAGNOSIS — Z0000 Encounter for general adult medical examination without abnormal findings: Secondary | ICD-10-CM

## 2024-02-27 DIAGNOSIS — Z0001 Encounter for general adult medical examination with abnormal findings: Secondary | ICD-10-CM

## 2024-02-27 DIAGNOSIS — I152 Hypertension secondary to endocrine disorders: Secondary | ICD-10-CM

## 2024-02-27 DIAGNOSIS — E785 Hyperlipidemia, unspecified: Secondary | ICD-10-CM

## 2024-02-27 DIAGNOSIS — E1159 Type 2 diabetes mellitus with other circulatory complications: Secondary | ICD-10-CM
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
## 2024-02-28 ENCOUNTER — Ambulatory Visit: Payer: Self-pay | Admitting: Family Medicine

## 2024-02-28 LAB — COMPREHENSIVE METABOLIC PANEL WITH GFR
ALT: 11 IU/L (ref 0–32)
AST: 13 IU/L (ref 0–40)
Albumin: 5.1 g/dL — ABNORMAL HIGH (ref 3.9–4.9)
Alkaline Phosphatase: 94 IU/L (ref 44–121)
BUN/Creatinine Ratio: 19 (ref 12–28)
BUN: 17 mg/dL (ref 8–27)
Bilirubin Total: 0.5 mg/dL (ref 0.0–1.2)
CO2: 19 mmol/L — ABNORMAL LOW (ref 20–29)
Calcium: 10.1 mg/dL (ref 8.7–10.3)
Chloride: 102 mmol/L (ref 96–106)
Creatinine, Ser: 0.88 mg/dL (ref 0.57–1.00)
Globulin, Total: 2.6 g/dL (ref 1.5–4.5)
Glucose: 115 mg/dL — ABNORMAL HIGH (ref 70–99)
Potassium: 4.4 mmol/L (ref 3.5–5.2)
Sodium: 142 mmol/L (ref 134–144)
Total Protein: 7.7 g/dL (ref 6.0–8.5)
eGFR: 74 mL/min/1.73 (ref >59)

## 2024-02-28 LAB — HEMOGLOBIN A1C
Est. average glucose Bld gHb Est-mCnc: 131 mg/dL
Hgb A1c MFr Bld: 6.2 % — ABNORMAL HIGH (ref 4.8–5.6)

## 2024-02-28 LAB — LIPID PANEL
Chol/HDL Ratio: 3.4 ratio (ref 0.0–4.4)
Cholesterol, Total: 148 mg/dL (ref 100–199)
HDL: 43 mg/dL (ref >39)
LDL Chol Calc (NIH): 87 mg/dL (ref 0–99)
Triglycerides: 97 mg/dL (ref 0–149)
VLDL Cholesterol Cal: 18 mg/dL (ref 5–40)

## 2024-02-28 LAB — MICROALBUMIN / CREATININE URINE RATIO
Creatinine, Urine: 57 mg/dL
Microalb/Creat Ratio: <5 mg/g{creat} (ref 0–29)
Microalbumin, Urine: <3 ug/mL

## 2024-02-28 NOTE — Assessment & Plan Note (Signed)
 Blood pressure is well-controlled with current regimen.

## 2024-02-28 NOTE — Assessment & Plan Note (Signed)
On atorvastatin 20 mg daily 

## 2024-02-28 NOTE — Assessment & Plan Note (Signed)
 Reports increased thirst and frequent urination, possibly exacerbated by Jardiance . Occasional blood sugar spikes noted, particularly after consuming high-carb foods. Overall blood sugar control is good, but monitoring is necessary to ensure stability. Jardiance  may contribute to increased urination but has improved blood sugar control. - Check A1c level. - Perform annual urine test for diabetes monitoring. - Advise taking Jardiance  at night to potentially reduce daytime urination. - Continue metformin  XR 1000 mg daily. - Monitor blood sugar levels, especially after high-carb meals.

## 2024-02-29 ENCOUNTER — Ambulatory Visit: Payer: Self-pay | Admitting: Family Medicine

## 2024-03-28 ENCOUNTER — Other Ambulatory Visit: Payer: Self-pay | Admitting: Family Medicine

## 2024-03-28 DIAGNOSIS — E1165 Type 2 diabetes mellitus with hyperglycemia: Secondary | ICD-10-CM

## 2024-03-28 DIAGNOSIS — G43709 Chronic migraine without aura, not intractable, without status migrainosus: Secondary | ICD-10-CM

## 2024-03-28 NOTE — Telephone Encounter (Signed)
 Copied from CRM [id number]. Topic: Clinical - Medication Refill >> Mar 28, 2024  2:25 PM Ivette P wrote: Medication:  - SUMAtriptan  (IMITREX ) 100 MG tablet - empagliflozin  (JARDIANCE ) 10 MG TABS tablet  - atorvastatin  (LIPITOR) 20 MG tablet - metFORMIN  (GLUCOPHAGE -XR) 500 MG 24 hr tablet   Has the patient contacted their pharmacy? Yes (Agent: If no, request that the patient contact the pharmacy for the refill. If patient does not wish to contact the pharmacy document the reason why and proceed with request.) (Agent: If yes, when and what did the pharmacy advise?)  This is the patient's preferred pharmacy:  [id number] [place], [place] - [address] AT [facility] [address] [place] [address] [place] [address] Phone: [phone] Fax: [phone]  Is this the correct pharmacy for this prescription? Yes If no, delete pharmacy and type the correct one.   Has the prescription been filled recently? No  Is the patient out of the medication? Yes, out of sumatriptan , Has enough metformin  until Saturday.   Has the patient been seen for an appointment in the last year OR does the patient have an upcoming appointment? Yes,08/28/2024  Can we respond through MyChart? Yes  Agent: Please be advised that Rx refills may take up to 3 business days. We ask that you follow-up with your pharmacy.

## 2024-03-30 MED ORDER — ATORVASTATIN CALCIUM 20 MG PO TABS
20.0000 mg | ORAL_TABLET | Freq: Every day | ORAL | 3 refills | Status: AC
Start: 2024-03-30 — End: ?

## 2024-03-30 MED ORDER — METFORMIN HCL ER 500 MG PO TB24: 1000.0000 mg | ORAL_TABLET | Freq: Every day | ORAL | 1 refills | Status: DC

## 2024-03-30 MED ORDER — EMPAGLIFLOZIN 10 MG PO TABS: 10.0000 mg | ORAL_TABLET | Freq: Every day | ORAL | 3 refills | Status: AC

## 2024-03-30 MED ORDER — SUMATRIPTAN SUCCINATE 100 MG PO TABS: 100.0000 mg | ORAL_TABLET | ORAL | 5 refills | Status: AC | PRN

## 2024-08-28 ENCOUNTER — Ambulatory Visit: Admitting: Family Medicine

## 2024-09-04 ENCOUNTER — Other Ambulatory Visit: Payer: Self-pay | Admitting: Family Medicine

## 2024-09-17 ENCOUNTER — Ambulatory Visit: Admitting: Family Medicine

## 2024-09-20 ENCOUNTER — Other Ambulatory Visit: Payer: Self-pay | Admitting: Family Medicine

## 2024-09-25 ENCOUNTER — Ambulatory Visit: Payer: Self-pay | Admitting: Family Medicine

## 2025-02-28 ENCOUNTER — Encounter: Admitting: Family Medicine
